# Patient Record
Sex: Male | Born: 1995 | Race: Black or African American | Hispanic: No | Marital: Single | Smoking: Never smoker
Health system: Southern US, Community
[De-identification: ages and names within clinical notes are randomized; demographics above are authoritative.]

## PROBLEM LIST (undated history)

## (undated) DIAGNOSIS — J45909 Unspecified asthma, uncomplicated: Secondary | ICD-10-CM

## (undated) DIAGNOSIS — K219 Gastro-esophageal reflux disease without esophagitis: Secondary | ICD-10-CM

## (undated) DIAGNOSIS — I10 Essential (primary) hypertension: Secondary | ICD-10-CM

## (undated) DIAGNOSIS — Z9109 Other allergy status, other than to drugs and biological substances: Secondary | ICD-10-CM

## (undated) DIAGNOSIS — M92519 Juvenile osteochondrosis of proximal tibia, unspecified leg: Secondary | ICD-10-CM

## (undated) DIAGNOSIS — F4310 Post-traumatic stress disorder, unspecified: Secondary | ICD-10-CM

## (undated) DIAGNOSIS — G4730 Sleep apnea, unspecified: Secondary | ICD-10-CM

## (undated) DIAGNOSIS — M9250 Juvenile osteochondrosis of tibia and fibula, unspecified leg: Secondary | ICD-10-CM

## (undated) HISTORY — DX: Post-traumatic stress disorder, unspecified: F43.10

## (undated) HISTORY — DX: Other allergy status, other than to drugs and biological substances: Z91.09

## (undated) HISTORY — DX: Juvenile osteochondrosis of proximal tibia, unspecified leg: M92.519

## (undated) HISTORY — DX: Juvenile osteochondrosis of tibia and fibula, unspecified leg: M92.50

---
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
## 2016-01-13 ENCOUNTER — Other Ambulatory Visit: Payer: Self-pay | Admitting: *Deleted

## 2016-01-13 DIAGNOSIS — J302 Other seasonal allergic rhinitis: Secondary | ICD-10-CM

## 2016-01-13 MED ORDER — CETIRIZINE HCL 10 MG PO TABS: ORAL_TABLET | ORAL | Status: DC

## 2016-01-13 MED ORDER — FLUTICASONE PROPIONATE 50 MCG/ACT NA SUSP: 2.0000 | Freq: Every day | NASAL | Status: DC

## 2016-02-13 DIAGNOSIS — F609 Personality disorder, unspecified: Secondary | ICD-10-CM | POA: Insufficient documentation

## 2016-02-17 ENCOUNTER — Ambulatory Visit (INDEPENDENT_AMBULATORY_CARE_PROVIDER_SITE_OTHER): Payer: Medicaid Other | Admitting: *Deleted

## 2016-02-17 DIAGNOSIS — Z111 Encounter for screening for respiratory tuberculosis: Secondary | ICD-10-CM | POA: Diagnosis not present

## 2016-02-17 NOTE — Progress Notes (Signed)
   PPD placed Left Forearm.  Pt to return 02/19/2016 for reading.  Pt tolerated intradermal injection. Clovis PuMartin, Marguerita Stapp L, RN

## 2016-02-19 ENCOUNTER — Ambulatory Visit (INDEPENDENT_AMBULATORY_CARE_PROVIDER_SITE_OTHER): Payer: Medicaid Other | Admitting: *Deleted

## 2016-02-19 ENCOUNTER — Encounter: Payer: Self-pay | Admitting: *Deleted

## 2016-02-19 DIAGNOSIS — Z7689 Persons encountering health services in other specified circumstances: Secondary | ICD-10-CM

## 2016-02-19 DIAGNOSIS — Z111 Encounter for screening for respiratory tuberculosis: Secondary | ICD-10-CM

## 2016-02-19 LAB — TB SKIN TEST: TB SKIN TEST: NEGATIVE

## 2016-02-19 NOTE — Progress Notes (Signed)
Patient ID: Aaron Alvarado, male   DOB: 1996-01-26, 20 y.o.   MRN: [id number]  Alvan Dameevin presents today for PPD read.  This was placed on 02/17/16 @ 9:13am.  It was read on 02/19/16 @ 9:20am.  There was a 9mm induration but pt with no risk factors (verified By Dr. Mauricio PoBreen).  Pt given letter to take to job. Venesha Petraitis, Maryjo RochesterJessica Dawn, CMA

## 2016-04-13 ENCOUNTER — Other Ambulatory Visit: Payer: Self-pay | Admitting: Family Medicine

## 2016-04-14 ENCOUNTER — Other Ambulatory Visit: Payer: Self-pay | Admitting: *Deleted

## 2016-04-14 DIAGNOSIS — J302 Other seasonal allergic rhinitis: Secondary | ICD-10-CM

## 2016-05-25 ENCOUNTER — Other Ambulatory Visit: Payer: Self-pay | Admitting: Family Medicine

## 2016-07-09 ENCOUNTER — Ambulatory Visit (HOSPITAL_COMMUNITY)
Admission: EM | Admit: 2016-07-09 | Discharge: 2016-07-09 | Disposition: A | Discharge status: Home / Self Care | Payer: Medicaid Other

## 2016-07-09 ENCOUNTER — Encounter (HOSPITAL_COMMUNITY): Payer: Self-pay | Admitting: Emergency Medicine

## 2016-07-09 DIAGNOSIS — R197 Diarrhea, unspecified: Secondary | ICD-10-CM

## 2016-07-09 NOTE — ED Provider Notes (Signed)
CSN: [id number]     Arrival date & time 07/09/16  1812 History   None    Chief Complaint  Patient presents with  . Rectal Problems   (Consider location/radiation/quality/duration/timing/severity/associated sxs/prior Treatment) Patient presents with diarrhea X 3 days with mucousy diarrhea X 2 episodes. Condition is acute in nature. Condition is made better by nothing Condition is made worse by worse Patient denies any treatment prior to there arrival at this facility. Patient denies any fevers but states that he has not had an appetite and the diarrhea is "getting better"Patient denies any fevers. Pain with urination, nausea vomiting or congestion.        Past Medical History:  Diagnosis Date  . Asthma   . Hypertension    Past Surgical History:  Procedure Laterality Date  . KNEE ARTHROSCOPY  2011   History reviewed. No pertinent family history. Social History  Substance Use Topics  . Smoking status: Never Smoker  . Smokeless tobacco: Never Used  . Alcohol use No    Review of Systems  Constitutional: Positive for appetite change. Negative for fever.  HENT: Negative.   Respiratory: Negative.   Gastrointestinal: Positive for abdominal pain and diarrhea.  Genitourinary: Negative.     Allergies  Penicillins  Home Medications   Prior to Admission medications   Medication Sig Start Date End Date Taking? Authorizing Provider  cetirizine (ZYRTEC) 10 MG tablet TAKE 1 TABLET BY MOUTH DAILY 05/25/16  Yes Nestor RampSara L Neal, MD  fluticasone [facility](FLONASE) 50 MCG/ACT nasal spray Place 2 sprays into both nostrils daily. 01/13/16 01/12/17 Yes Nestor RampSara L Neal, MD  azithromycin (ZITHROMAX) 250 MG tablet 2 tabs po for 5 days. Rph, please call pt when ready. 05/27/15   Hayden Rasmussenavid Mabe, NP  ketorolac (TORADOL) 10 MG tablet Take 1 tablet (10 mg total) by mouth 4 (four) times daily as needed. 05/23/15   Hayden Rasmussenavid Mabe, NP  PROAIR HFA 108 (90 Base) MCG/ACT inhaler INHALE 2-4 PUFFS INTO THE LUNGS AS NEEDED FOR WHEEZING.  EVERY 4-6 HRS AS NEEDED WHEEZING 04/13/16   Moses MannersWilliam A Hensel, MD  traMADol (ULTRAM) 50 MG tablet Take 1 tablet (50 mg total) by mouth every 6 (six) hours as needed. 05/23/15   Hayden Rasmussenavid Mabe, NP   Meds Ordered and Administered this Visit  Medications - No data to display  BP 135/83 (BP Location: Right Arm)   Pulse 73   Temp 98 F (36.7 C) (Oral)   Resp 12   SpO2 99%  No data found.   Physical Exam  Constitutional: He appears well-developed and well-nourished.  Cardiovascular: Normal rate and regular rhythm.   Pulmonary/Chest: Effort normal and breath sounds normal.  Abdominal: Soft. There is tenderness ( lower quadrants).  Hyperactive bowels sounds noted.     Urgent Care Course   Clinical Course    Procedures (including critical care time)  Labs Review Labs Reviewed - No data to display  Imaging Review No results found.   Visual Acuity Review  Right Eye Distance:   Left Eye Distance:   Bilateral Distance:    Right Eye Near:   Left Eye Near:    Bilateral Near:         MDM   1. Diarrhea, unspecified type       Alene MiresJennifer C Marillyn Goren, NP 07/09/16 1906    Alene MiresJennifer C Javonn Gauger, NP 07/09/16 1911

## 2016-07-09 NOTE — ED Triage Notes (Signed)
Pt reports he noticed a "mucousy" rectal d/c this am associated w/diarrhea and abd pain  Denies fevers, chills  Sexually active w/male partner  A&O x4... NAD

## 2016-07-09 NOTE — ED Notes (Signed)
Pt d/c by Jennifer, NP 

## 2016-07-09 NOTE — Discharge Instructions (Signed)
Diet as tolerated. Continue to push fluids.

## 2016-10-04 ENCOUNTER — Ambulatory Visit: Payer: Medicaid Other | Admitting: Student

## 2016-10-04 ENCOUNTER — Telehealth: Payer: Self-pay | Admitting: *Deleted

## 2016-10-04 NOTE — Telephone Encounter (Signed)
Ok I will see him as well THANKS! Denny LevySara Mahki Spikes

## 2016-10-04 NOTE — Telephone Encounter (Signed)
Patient originally was scheduled on same day today for a sore throat but mom called to cancel it.  She would like for patient to be seen the same time that she is coming in to see Dr. Jennette KettleNeal on Wednesday 11/22.  Offered her an appointment with another provider at the same time on Wednesday since PCP's schedule is full but mom declined and asked me to check with pcp to see if patient can be squeezed in.  Mom plans to bring son in with her on this day anyway. Will forward to Md to advise. Tacarra Justo,CMA

## 2016-10-04 NOTE — Telephone Encounter (Signed)
Patient is aware. Jazmin Hartsell,CMA  

## 2016-10-06 ENCOUNTER — Encounter: Payer: Self-pay | Admitting: Family Medicine

## 2016-10-06 ENCOUNTER — Ambulatory Visit (INDEPENDENT_AMBULATORY_CARE_PROVIDER_SITE_OTHER): Payer: Medicaid Other | Admitting: Family Medicine

## 2016-10-06 DIAGNOSIS — J302 Other seasonal allergic rhinitis: Secondary | ICD-10-CM | POA: Diagnosis not present

## 2016-10-06 DIAGNOSIS — Z23 Encounter for immunization: Secondary | ICD-10-CM | POA: Diagnosis not present

## 2016-10-06 MED ORDER — AZITHROMYCIN 250 MG PO TABS: ORAL_TABLET | ORAL | 0 refills | Status: DC

## 2016-10-06 MED ORDER — CETIRIZINE HCL 10 MG PO TABS: 10.0000 mg | ORAL_TABLET | Freq: Every day | ORAL | 3 refills | Status: DC

## 2016-10-06 MED ORDER — FLUTICASONE PROPIONATE 50 MCG/ACT NA SUSP: 2.0000 | Freq: Every day | NASAL | 12 refills | Status: DC

## 2016-10-06 NOTE — Progress Notes (Signed)
    CHIEF COMPLAINT / HPI:  Bilateral ear pain. Also had a bit of a sore throat. Has not had fever but has felt a little bit fatigued. No cough. #2. Has a lesion on his face that is bothersome to him. It has been progressively enlarging over the last 2 months. Not itchy. No bleeding. REVIEW OF SYSTEMS:  See history of present illness  OBJECTIVE:  Vital signs are reviewed.   GEN.: Well-developed male no acute distress SKIN: Pedunculated lesion with some tuffs on the right cheek HEENT: TMs bilaterally red, poor details, poorly mobile oropharynx is erythematous without any the Xarelto which are reviewed  ASSESSMENT / PLAN: Otitis media we'll treat with azithromycin #2. Facial lesion that looks like a wart. I'll have him come back to the office for removal.

## 2016-10-14 ENCOUNTER — Ambulatory Visit (INDEPENDENT_AMBULATORY_CARE_PROVIDER_SITE_OTHER): Payer: Medicaid Other | Admitting: Family Medicine

## 2016-10-14 VITALS — BP 183/90 | HR 82 | Temp 98.1°F | Ht 62.0 in | Wt 265.6 lb

## 2016-10-14 DIAGNOSIS — B079 Viral wart, unspecified: Secondary | ICD-10-CM | POA: Diagnosis present

## 2016-10-14 NOTE — Progress Notes (Signed)
Wart removal.  Patient given informed consent, signed copy in the chart. Area of right cheek cleaned and prepped in usual sterile fashion. One cc of 1% lidocaine was used for local anesthesia. The0.6 cm (at base)  filliform wart was then shaved at the base and removed. A small amount of monsel's solution and local pressure  was used for hemostasis which was easily obtained. The area was cleaned and covered with a ]bandaid. The wart will be sent for pathology due to its rapid growth and unusual presentation. After care instructions were provided. I will notify him of pathology findings.

## 2016-10-19 ENCOUNTER — Encounter: Payer: Self-pay | Admitting: Family Medicine

## 2016-11-17 ENCOUNTER — Encounter: Payer: Self-pay | Admitting: Family Medicine

## 2016-11-17 ENCOUNTER — Ambulatory Visit (INDEPENDENT_AMBULATORY_CARE_PROVIDER_SITE_OTHER): Payer: Medicaid Other | Admitting: Family Medicine

## 2016-11-17 VITALS — BP 116/88 | HR 57 | Temp 98.5°F | Wt 272.0 lb

## 2016-11-17 DIAGNOSIS — J029 Acute pharyngitis, unspecified: Secondary | ICD-10-CM

## 2016-11-17 DIAGNOSIS — H6591 Unspecified nonsuppurative otitis media, right ear: Secondary | ICD-10-CM

## 2016-11-17 DIAGNOSIS — H6691 Otitis media, unspecified, right ear: Secondary | ICD-10-CM

## 2016-11-17 DIAGNOSIS — H6690 Otitis media, unspecified, unspecified ear: Secondary | ICD-10-CM | POA: Insufficient documentation

## 2016-11-17 LAB — POCT RAPID STREP A (OFFICE): RAPID STREP A SCREEN: NEGATIVE

## 2016-11-17 MED ORDER — AZITHROMYCIN 250 MG PO TABS: ORAL_TABLET | ORAL | 0 refills | Status: DC

## 2016-11-17 NOTE — Assessment & Plan Note (Signed)
Most likely viral in nature. Rapid strep negative here. Given constellation of symptoms, less likely strep given cough, afebrile, no LAD, no exudate, and age. No culture sent for this reason. No red flags on exam. - discussed symptomatic management with things like gargling with salt water, lozenges, chloraseptic spray, honey for cough, humidifer.  - return precaution discussed.

## 2016-11-17 NOTE — Patient Instructions (Signed)
I have prescribed you an antibiotic (azithromycin) for a right ear infection. Continue Flonase, Zyrtec, and Sudafed for the congestion. Start using saline washes (like the Neti pot) as well. If your symptoms worsen or fail to improve, please follow up in our clinic.

## 2016-11-17 NOTE — Progress Notes (Signed)
    Subjective: ZO:XWRUCC:sore throat HPI: Patient is a 21 y.o. male presenting to clinic today for a SDA for sore throat.  Friday patient noted a sore throat which worsened on Sunday. Throat pain has been stable since then.  Patient notes rhinorrhea and nasal congestion which started on Sunday. He notes cough that started on Monday. Cough is non-productive.   On ROS, he denies SOB or chest pain, otalgias, myalgias. Fevers/chills.   He denies sick contacts.   He takes Flonase and Zyrtec. He took Sudafed (phenylephrine) yesterday.   Social History: never smoker  Flu Vaccine: up to date   ROS: All other systems reviewed and are negative.  Past Medical History Patient Active Problem List   Diagnosis Date Noted  . Otitis media 11/17/2016  . Headache 05/26/2015  . Acute maxillary sinusitis 01/20/2015  . Acute pharyngitis 01/16/2015  . 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 H/O: HTN (hypertension) 10/26/2011  . History of acanthosis skin now resolved 2015 10/26/2011  . Historyof GERD 05/06/2011  . Obesity, unspecified 04/10/2010  . Childhood asthma 07/05/2007  . Allergic rhinitis 01/12/2007    Medications- reviewed and updated  Objective: Office vital signs reviewed. BP 116/88   Pulse (!) 57   Temp 98.5 F (36.9 C) (Oral)   Wt 272 lb (123.4 kg)   BMI 49.75 kg/m    Physical Examination:  General: Awake, alert, well- nourished, NAD No LAD noted.  No facial tenderness noted.  ENMT: Initially noted to have complete cerumen impaction on R, partial on the left. After irrigation,  L TM intact, normal light reflex, no erythema, no bulging. R TM intact, erythematous, bulging, with small amount of purulence.  Nasal turbinates boggy. MMM, Oropharynx clear with mild erythema. No tonsillar exudate/hypertrophy Eyes: Conjunctiva non-injected. PERRL.  Cardio: RRR, no m/r/g noted.  Pulm: No increased WOB.  CTAB, without wheezes, rhonchi or crackles noted.   Rapid strep  negative  Assessment/Plan: Acute pharyngitis Most likely viral in nature. Rapid strep negative here. Given constellation of symptoms, less likely strep given cough, afebrile, no LAD, no exudate, and age. No culture sent for this reason. No red flags on exam. - discussed symptomatic management with things like gargling with salt water, lozenges, chloraseptic spray, honey for cough, humidifer.  - return precaution discussed.   Otitis media Discussed this is most likely viral in origin and would most likely would resolve, however patient opted for antibiotic therapy. - rx for azithromycin (as pt has a h/o hives with PCN) - discussed return precautions    Orders Placed This Encounter  Procedures  . POCT rapid strep A    Meds ordered this encounter  Medications  . azithromycin (ZITHROMAX) 250 MG tablet    Sig: Take 2 tablets (500mg ) on first day, then daily after that    Dispense:  6 each    Refill:  0    Joanna Puffrystal S. Xai Frerking PGY-3, [facility]

## 2016-11-17 NOTE — Assessment & Plan Note (Signed)
Discussed this is most likely viral in origin and would most likely would resolve, however patient opted for antibiotic therapy. - rx for azithromycin (as pt has a h/o hives with PCN) - discussed return precautions

## 2016-12-26 ENCOUNTER — Other Ambulatory Visit: Payer: Self-pay | Admitting: Family Medicine

## 2017-02-09 ENCOUNTER — Ambulatory Visit (INDEPENDENT_AMBULATORY_CARE_PROVIDER_SITE_OTHER): Payer: Medicaid Other | Admitting: Family Medicine

## 2017-02-09 ENCOUNTER — Encounter: Payer: Self-pay | Admitting: Family Medicine

## 2017-02-09 VITALS — BP 110/85 | HR 94 | Temp 97.8°F | Ht 71.26 in | Wt 249.2 lb

## 2017-02-09 DIAGNOSIS — K5900 Constipation, unspecified: Secondary | ICD-10-CM

## 2017-02-09 MED ORDER — POLYETHYLENE GLYCOL 3350 17 GM/SCOOP PO POWD: 17.0000 g | Freq: Every day | ORAL | 0 refills | Status: DC

## 2017-02-09 NOTE — Patient Instructions (Signed)
High-Fiber Diet Fiber, also called dietary fiber, is a type of carbohydrate found in fruits, vegetables, whole grains, and beans. A high-fiber diet can have many health benefits. Your health care provider may recommend a high-fiber diet to help:  Prevent constipation. Fiber can make your bowel movements more regular.  Lower your cholesterol.  Relieve hemorrhoids, uncomplicated diverticulosis, or irritable bowel syndrome.  Prevent overeating as part of a weight-loss plan.  Prevent heart disease, type 2 diabetes, and certain cancers.  What is my plan? The recommended daily intake of fiber includes:  38 grams for men under age 50.  30 grams for men over age 50.  25 grams for women under age 50.  21 grams for women over age 50.  You can get the recommended daily intake of dietary fiber by eating a variety of fruits, vegetables, grains, and beans. Your health care provider may also recommend a fiber supplement if it is not possible to get enough fiber through your diet. What do I need to know about a high-fiber diet?  Fiber supplements have not been widely studied for their effectiveness, so it is better to get fiber through food sources.  Always check the fiber content on thenutrition facts label of any prepackaged food. Look for foods that contain at least 5 grams of fiber per serving.  Ask your dietitian if you have questions about specific foods that are related to your condition, especially if those foods are not listed in the following section.  Increase your daily fiber consumption gradually. Increasing your intake of dietary fiber too quickly may cause bloating, cramping, or gas.  Drink plenty of water. Water helps you to digest fiber. What foods can I eat? Grains Whole-grain breads. Multigrain cereal. Oats and oatmeal. Brown rice. Barley. Bulgur wheat. Millet. Bran muffins. Popcorn. Rye wafer crackers. Vegetables Sweet potatoes. Spinach. Kale. Artichokes. Cabbage.  Broccoli. Green peas. Carrots. Squash. Fruits Berries. Pears. Apples. Oranges. Avocados. Prunes and raisins. Dried figs. Meats and Other Protein Sources Navy, kidney, pinto, and soy beans. Split peas. Lentils. Nuts and seeds. Dairy Fiber-fortified yogurt. Beverages Fiber-fortified soy milk. Fiber-fortified orange juice. Other Fiber bars. The items listed above may not be a complete list of recommended foods or beverages. Contact your dietitian for more options. What foods are not recommended? Grains White bread. Pasta made with refined flour. White rice. Vegetables Fried potatoes. Canned vegetables. Well-cooked vegetables. Fruits Fruit juice. Cooked, strained fruit. Meats and Other Protein Sources Fatty cuts of meat. Fried poultry or fried fish. Dairy Milk. Yogurt. Cream cheese. Sour cream. Beverages Soft drinks. Other Cakes and pastries. Butter and oils. The items listed above may not be a complete list of foods and beverages to avoid. Contact your dietitian for more information. What are some tips for including high-fiber foods in my diet?  Eat a wide variety of high-fiber foods.  Make sure that half of all grains consumed each day are whole grains.  Replace breads and cereals made from refined flour or white flour with whole-grain breads and cereals.  Replace white rice with brown rice, bulgur wheat, or millet.  Start the day with a breakfast that is high in fiber, such as a cereal that contains at least 5 grams of fiber per serving.  Use beans in place of meat in soups, salads, or pasta.  Eat high-fiber snacks, such as berries, raw vegetables, nuts, or popcorn. This information is not intended to replace advice given to you by your health care provider. Make sure you discuss any   questions you have with your health care provider. Document Released: 11/01/2005 Document Revised: 04/08/2016 Document Reviewed: 04/16/2014 Elsevier Interactive Patient Education  2017  Elsevier Inc.  

## 2017-02-09 NOTE — Assessment & Plan Note (Signed)
Symptoms consistent with constipation Benign abdominal exam and vital signs stable Discussed increasing fiber in his diet Start daily Miralax and can increase to twice daily as needed for one soft bowel movement daily Return precautions discussed

## 2017-02-09 NOTE — Progress Notes (Signed)
   Subjective:   Aaron Alvarado is a 21 y.o. male with a history of GERD, allergic rhinitis, obesity here for same day appt for  Chief Complaint  Patient presents with  . Abdominal Pain    x 1 year/ getting worse     Patient reports diffuse, intermittent abdominal pain for about one year that worsened 2 days ago. He reports he often has sharp pains when he needs to have a bowel movement and then has to strain and has hard stools. He is tried Pepto-Bismol, but this does not help. He became concerned 2 days ago when he had a sharp upper abdominal pain immediately after eating a meal of fried chicken and black-eyed peas. This lasted for 10 minutes and self resolved and has not recurred. He denies any nausea, vomiting, diarrhea, repeat episodes of sharp pain, fevers, hematemesis, hematochezia, melena, dysuria, unintentional weight loss, loss of appetite, dysphagia.  Review of Systems:  Per HPI.   Social History: never smoker  Objective:  BP 110/85 (BP Location: Right Arm, Patient Position: Sitting, Cuff Size: Large)   Pulse 94   Temp 97.8 F (36.6 C) (Oral)   Ht 5' 11.26" (1.81 m)   Wt 249 lb 3.2 oz (113 kg)   SpO2 98%   BMI 34.50 kg/m   Gen:  21 y.o. male in NAD HEENT: NCAT, MMM, anicteric sclerae, OP clear CV: RRR, no MRG Resp: Non-labored, CTAB, no wheezes noted Abd: Soft, diffusely mildly TTP, ND, BS present, no guarding/rebound Ext: WWP, no edema MSK: No obvious deformities, gait intact Neuro: Alert and oriented, speech normal     Assessment & Plan:     Aaron Alvarado is a 21 y.o. male here for  Constipation Symptoms consistent with constipation Benign abdominal exam and vital signs stable Discussed increasing fiber in his diet Start daily Miralax and can increase to twice daily as needed for one soft bowel movement daily Return precautions discussed   Erasmo DownerAngela M Bacigalupo, MD MPH PGY-3,  [facility] 02/09/2017  9:03 AM

## 2017-03-17 ENCOUNTER — Ambulatory Visit (INDEPENDENT_AMBULATORY_CARE_PROVIDER_SITE_OTHER): Payer: Medicaid Other | Admitting: Family Medicine

## 2017-03-17 ENCOUNTER — Other Ambulatory Visit (HOSPITAL_COMMUNITY)
Admission: RE | Admit: 2017-03-17 | Discharge: 2017-03-17 | Disposition: A | Discharge status: Home / Self Care | Payer: Medicaid Other | Source: Ambulatory Visit | Attending: Family Medicine | Admitting: Family Medicine

## 2017-03-17 VITALS — BP 128/70 | HR 65 | Temp 98.3°F | Wt 243.2 lb

## 2017-03-17 DIAGNOSIS — X58XXXA Exposure to other specified factors, initial encounter: Secondary | ICD-10-CM | POA: Diagnosis not present

## 2017-03-17 DIAGNOSIS — Z113 Encounter for screening for infections with a predominantly sexual mode of transmission: Secondary | ICD-10-CM | POA: Insufficient documentation

## 2017-03-17 DIAGNOSIS — Z7721 Contact with and (suspected) exposure to potentially hazardous body fluids: Secondary | ICD-10-CM

## 2017-03-17 NOTE — Progress Notes (Signed)
   Subjective:  Aaron Alvarado is a 21 y.o. male [facility] with a chief complaint of needle exposure.   HPI:  Needle Exposure Patient was walking home from school yesterday when he noticed a sharp pain in the bottom of his right foot. He looked down and found a hypodermic needle sticking throughout the sole of his shoe into his foot. He wants to be tested for communicable diseases today including HIV. He is up to date with his tetanus shot.  No fevers, chills. No tenderness, redness, or drainage at the site of the wound.   ROS: Per HPI  PMH: Smoking history reviewed.   Objective:  Physical Exam: BP 128/70   Pulse 65   Temp 98.3 F (36.8 C) (Oral)   Wt 243 lb 3.2 oz (110.3 kg)   SpO2 97%   BMI 33.67 kg/m   Gen: NAD, resting comfortably MSK:  - R foot: Small needle mark noted without surrounding erythema or drainage. Skin: warm, dry Neuro: grossly normal, moves all extremities Psych: Normal affect and thought content  Assessment/Plan:  Possible Bloodborne pathogen exposure Called the National Clinician's Post-Exposure Prophylaxis Hotline at [phone]. They recommended against post exposure prophylaxis against HIV. Recommended checking HIV antibody, hepatitis C antibody, and hepatitis B titers today. If negative, recommended rechecking HIV antibody at 3 weeks and then 6 months. Recommended rechecking hepatitis C at 3 months and again at 6 months. Discussed this with patient who voiced understanding. Patient up to date with tetanus - no need for vaccination today.  STD screening Will check RPR and GC/CT in addition today per patient request.   Katina Degreealeb M. Jimmey RalphParker, MD [facility] Resident PGY-3 03/17/2017 12:25 PM

## 2017-03-17 NOTE — Patient Instructions (Signed)
We will check blood work today.  Come back in 3 weeks for repeat testing.  Take care,  Dr Jimmey RalphParker

## 2017-03-17 NOTE — Assessment & Plan Note (Signed)
Called the National Clinician's Post-Exposure Prophylaxis Hotline at [phone]. They recommended against post exposure prophylaxis against HIV. Recommended checking HIV antibody, hepatitis C antibody, and hepatitis B titers today. If negative, recommended rechecking HIV antibody at 3 weeks and then 6 months. Recommended rechecking hepatitis C at 3 months and again at 6 months. Discussed this with patient who voiced understanding. Patient up to date with tetanus - no need for vaccination today.

## 2017-03-18 LAB — HEPATITIS B SURFACE ANTIGEN: Hepatitis B Surface Ag: NEGATIVE

## 2017-03-18 LAB — HIV ANTIBODY (ROUTINE TESTING W REFLEX): HIV Screen 4th Generation wRfx: NONREACTIVE

## 2017-03-18 LAB — RPR: RPR Ser Ql: NONREACTIVE

## 2017-03-18 LAB — URINE CYTOLOGY ANCILLARY ONLY
CHLAMYDIA, DNA PROBE: NEGATIVE
NEISSERIA GONORRHEA: NEGATIVE

## 2017-03-18 LAB — HEPATITIS B CORE ANTIBODY, TOTAL: Hep B Core Total Ab: NEGATIVE

## 2017-03-18 LAB — HEPATITIS C ANTIBODY: Hep C Virus Ab: <0.1 s/co ratio (ref 0.0–0.9)

## 2017-03-18 LAB — HEPATITIS B SURFACE ANTIBODY,QUALITATIVE: HEP B SURFACE AB, QUAL: NONREACTIVE

## 2017-03-21 ENCOUNTER — Telehealth: Payer: Self-pay | Admitting: Family Medicine

## 2017-03-21 NOTE — Telephone Encounter (Signed)
Attempted to call patient, though no answer and no VM set up. Lab results are negative but they also show that he is not immune to hepatitis B. He will need to complete the vaccine series again.  Katina Degreealeb M. Jimmey RalphParker, MD [facility] Resident PGY-3 03/21/2017 10:56 AM

## 2017-03-21 NOTE — Telephone Encounter (Signed)
Pt informed of negative test results and nurse visit was scheduled for Hep b vaccine.

## 2017-03-23 ENCOUNTER — Ambulatory Visit: Payer: Medicaid Other

## 2017-03-29 ENCOUNTER — Ambulatory Visit: Payer: Medicaid Other

## 2017-07-13 ENCOUNTER — Ambulatory Visit (INDEPENDENT_AMBULATORY_CARE_PROVIDER_SITE_OTHER): Payer: Medicaid Other | Admitting: *Deleted

## 2017-07-13 DIAGNOSIS — Z111 Encounter for screening for respiratory tuberculosis: Secondary | ICD-10-CM | POA: Diagnosis not present

## 2017-07-13 NOTE — Progress Notes (Signed)
   PPD placed Left Forearm.  Pt to return 07/15/2017 for reading.  Pt tolerated intradermal injection. Clovis Pu, RN

## 2017-07-15 ENCOUNTER — Ambulatory Visit (INDEPENDENT_AMBULATORY_CARE_PROVIDER_SITE_OTHER): Payer: Medicaid Other | Admitting: *Deleted

## 2017-07-15 ENCOUNTER — Encounter: Payer: Self-pay | Admitting: *Deleted

## 2017-07-15 DIAGNOSIS — Z111 Encounter for screening for respiratory tuberculosis: Secondary | ICD-10-CM

## 2017-07-15 LAB — TB SKIN TEST
Induration: 0 mm
TB Skin Test: NEGATIVE

## 2017-07-15 NOTE — Progress Notes (Signed)
   PPD Reading Note PPD read and results entered in EpicCare. Result: 0 mm induration. Interpretation: Negative If test not read within 48-72 hours of initial placement, patient advised to repeat in other arm 1-3 weeks after this test. Allergic reaction: no  Josia Cueva L, RN  

## 2017-10-04 ENCOUNTER — Ambulatory Visit (INDEPENDENT_AMBULATORY_CARE_PROVIDER_SITE_OTHER): Payer: Medicaid Other | Admitting: Student

## 2017-10-04 ENCOUNTER — Encounter: Payer: Self-pay | Admitting: Student

## 2017-10-04 ENCOUNTER — Other Ambulatory Visit: Payer: Self-pay

## 2017-10-04 ENCOUNTER — Other Ambulatory Visit (HOSPITAL_COMMUNITY)
Admission: RE | Admit: 2017-10-04 | Discharge: 2017-10-04 | Disposition: A | Discharge status: Home / Self Care | Payer: Medicaid Other | Source: Ambulatory Visit | Attending: Family Medicine | Admitting: Family Medicine

## 2017-10-04 VITALS — BP 124/72 | HR 83 | Temp 98.4°F | Ht 71.0 in | Wt 259.4 lb

## 2017-10-04 DIAGNOSIS — Z7251 High risk heterosexual behavior: Secondary | ICD-10-CM

## 2017-10-04 DIAGNOSIS — Z23 Encounter for immunization: Secondary | ICD-10-CM | POA: Diagnosis not present

## 2017-10-04 DIAGNOSIS — Z113 Encounter for screening for infections with a predominantly sexual mode of transmission: Secondary | ICD-10-CM | POA: Insufficient documentation

## 2017-10-04 NOTE — Patient Instructions (Addendum)
It was great seeing you today! It appears that you have a viral upper respiratory infection (Common Cold).  Cold symptoms can last up to 2 weeks.    - A tablespoonful of honey before bedtime is helpful for cough - Get plenty of rest and adequate hydration. - Consume warm fluids (soup or tea) to provide relief for a stuffy nose and to loosen phlegm. - For nasal stuffiness, try saline nasal spray or a Neti Pot. Eating warm liquids such as chicken soup or tea may also help with nasal congestion. - For sore throat pain relief: suck on throat lozenges, hard candy or popsicles; gargle with warm salt water (1/4 tsp. salt per 8 oz. of water); and eat soft, bland foods. - Eat a well-balanced diet. If you cannot, ensure you are getting enough nutrients by taking a daily multivitamin. - Avoid dairy products, as they can thicken phlegm. - Avoid alcohol, as it impairs your body's immune system.  CONTACT YOUR DOCTOR IF YOU EXPERIENCE ANY OF THE FOLLOWING: - High fever, chest pain, shortness of breath or  not able to keep down food or fluids.  - Cough that gets worse while other cold symptoms improve - Flare up of any chronic lung problem, such as asthma - Your symptoms persist longer than 2 weeks  https://owens.org/https://www.cdc.gov/hiv/risk/prep/index.html  If we did any lab work today, and the results require attention, either me or my nurse will get in touch with you. If everything is normal, you will get a letter in mail and a message via . If you don't hear from us in two weeks, please give us a call. Otherwise, we look forward to seeing you again at your next visit. If you have any questions or concerns before then, please call the clinic at [phone].  Please bring all your medications to every doctors visit  Sign up for My Chart to have easy access to your labs results, and communication with your Primary care physician.    Please check-out at the front desk before leaving the clinic.    Take Care,    Dr. Alanda SlimGonfa

## 2017-10-04 NOTE — Progress Notes (Cosign Needed)
  Subjective:    Aaron Alvarado is a 21 y.o. old male here for sore throat  HPI Sore throat: this has been going on for three days. He went out partying three days ago and his throat started burning the next day. He also had "low energy" and felt "unwell". He had cough, sneezing and nasal congestion. His throat hurts with cold water but not with warm fluid. Cough is mostly dry. Denies fever, shortness of breath. Some chest pain with cough or sneezing. His throat is better today. He is sexually active with males and females. Reports two male partners in the last 6 months. He is on both receiving and penetrating. He reports both oral and anal sex. Denies using condom. Reports having oral sex three days ago before his throat starting hurting. Denies penile discharge, lesion, scrotal pain or anal discharge.    Denies history of STD, smoking, drinking or recreational drug use PMH/Problem List: has Obesity, unspecified; Allergic rhinitis; Childhood asthma; Historyof GERD; H/O: HTN (hypertension); History of acanthosis skin now resolved 2015; Blount's disease; Constipation; Headache; and Needle exposure on their problem list.   has a past medical history of Asthma and Hypertension.  FH:  No family history on file.  SH Social History   Tobacco Use  . Smoking status: Never Smoker  . Smokeless tobacco: Never Used  Substance Use Topics  . Alcohol use: No  . Drug use: No    Review of Systems Review of systems negative except for pertinent positives and negatives in history of present illness above.     Objective:     Vitals:   10/04/17 1140  BP: 124/72  Pulse: 83  Temp: 98.4 F (36.9 C)  TempSrc: Oral  SpO2: 98%  Weight: 259 lb 6.4 oz (117.7 kg)  Height: 5\' 11"  (1.803 m)   Body mass index is 36.18 kg/m.  Physical Exam GEN: appears well, no ditress EYES: PERRL, EOMI THROAT: MMM, no erythema or exudation, no cobblestoning NECK: Supple, mild l tenderness to palpation over anterior  extremities bilaterally.  No palpable lymph nodes.  Full range of motion. RESP:  No IWOB, CTAB CVS:  RRR, normal S1&S2, no murmurs GI: soft, NT with active BS GU: Patient declined.  Denies lesion or abnormality MSK: No focal tenderness NEURO: Grossly intact PSYCH: normal affect    Assessment and Plan:  1. Screening for STD (sexually transmitted disease): MSM patient at increased risk of STI.  Discussed about PEP.  He is interested in this.  I have also recommended consistent use of protection (condom).  I would refer him to ID clinic for further discussion and initiation of PEP. - Hepatitis B surface antigen - Hepatitis B core antibody, IgM - HIV antibody - Urine cytology ancillary only - RPR - Oropharyngeal swab for GC/CT  2.  Sore throat: Likely due to viral pharyngitis but cannot rule out STI.  -Obtain oropharyngeal swab GC/CT -He has 1/4 on Centor's criteria that argues against strep pharyngitis -Recommended conservative management  3. Need for immunization against influenza - Flu Vaccine QUAD 36+ mos IM  Return in about 1 week (around 10/11/2017) for Medication/PEP.  Almon Herculesaye T Gordon Vandunk, MD 10/04/17 Pager: [phone]

## 2017-10-05 LAB — HIV ANTIBODY (ROUTINE TESTING W REFLEX): HIV Screen 4th Generation wRfx: NONREACTIVE

## 2017-10-05 LAB — HEPATITIS B CORE ANTIBODY, IGM: Hep B C IgM: NEGATIVE

## 2017-10-05 LAB — URINE CYTOLOGY ANCILLARY ONLY
Chlamydia: NEGATIVE
Neisseria Gonorrhea: NEGATIVE
Trichomonas: NEGATIVE

## 2017-10-05 LAB — RPR: RPR Ser Ql: NONREACTIVE

## 2017-10-05 LAB — CERVICOVAGINAL ANCILLARY ONLY
Chlamydia: NEGATIVE
Neisseria Gonorrhea: NEGATIVE

## 2017-10-05 LAB — HEPATITIS B SURFACE ANTIGEN: HEP B S AG: NEGATIVE

## 2017-10-26 ENCOUNTER — Other Ambulatory Visit: Payer: Self-pay | Admitting: Family Medicine

## 2017-10-26 ENCOUNTER — Ambulatory Visit (INDEPENDENT_AMBULATORY_CARE_PROVIDER_SITE_OTHER): Payer: Medicaid Other | Admitting: Family Medicine

## 2017-10-26 ENCOUNTER — Other Ambulatory Visit: Payer: Self-pay

## 2017-10-26 ENCOUNTER — Encounter: Payer: Self-pay | Admitting: Family Medicine

## 2017-10-26 VITALS — BP 124/72 | HR 61 | Temp 98.4°F | Ht 71.0 in | Wt 268.6 lb

## 2017-10-26 DIAGNOSIS — Z7251 High risk heterosexual behavior: Secondary | ICD-10-CM | POA: Insufficient documentation

## 2017-10-26 DIAGNOSIS — Z202 Contact with and (suspected) exposure to infections with a predominantly sexual mode of transmission: Secondary | ICD-10-CM | POA: Diagnosis not present

## 2017-10-26 MED ORDER — EMTRICITABINE-TENOFOVIR DF 200-300 MG PO TABS: 1.0000 | ORAL_TABLET | Freq: Every day | ORAL | 4 refills | Status: DC

## 2017-10-26 MED FILL — TRUVADA 200-300 MG TABS: 200-300 | 30 days supply | Qty: 30 | Fill #0

## 2017-10-26 NOTE — Assessment & Plan Note (Signed)
Discussed sex, STD, PrEP. Handouts given. rx for Truvada. Appropriate labs drawn. Discussed monitoring, condoms. Greater than 50% of our 25 minute office visit was spent in counseling and education regarding these issues.

## 2017-10-26 NOTE — Progress Notes (Signed)
    CHIEF COMPLAINT / HPI:  Here for discussion about STDs and specifically about preexposure prophylaxis for HIV.  No current symptoms.  Sexually active with males and females.  Penetration and reception with males.  REVIEW OF SYSTEMS:  No fever.  No weight loss.  No genital lesions.  No burning on urination.  No skin lesions or rash.  PERTINENT  PMH / PSH: I have reviewed the patient's medications, allergies, past medical and surgical history, smoking status and updated in the EMR as appropriate.   OBJECTIVE:    GENERAL: Well-developed male no acute distress PSYCHIATRIC: Alert and oriented x4.  Affect is interactive. LABORATORY review: Negative HIV, negative RPR, negative hepatitis B core antibody and surface antigen.  Recent urine cytology negative for chlamydia, negative gonorrhea, negative trichomonas.  ASSESSMENT / PLAN: Please see problem oriented charting for details

## 2017-10-27 LAB — CMP14+EGFR
ALBUMIN: 4 g/dL (ref 3.5–5.5)
ALK PHOS: 59 IU/L (ref 39–117)
ALT: 19 IU/L (ref 0–44)
AST: 15 IU/L (ref 0–40)
Albumin/Globulin Ratio: 1.4 (ref 1.2–2.2)
BILIRUBIN TOTAL: 0.5 mg/dL (ref 0.0–1.2)
BUN / CREAT RATIO: 11 (ref 9–20)
BUN: 11 mg/dL (ref 6–20)
CHLORIDE: 105 mmol/L (ref 96–106)
CO2: 26 mmol/L (ref 20–29)
CREATININE: 0.99 mg/dL (ref 0.76–1.27)
Calcium: 9.6 mg/dL (ref 8.7–10.2)
GFR calc Af Amer: 126 mL/min/{1.73_m2} (ref >59)
GFR calc non Af Amer: 109 mL/min/{1.73_m2} (ref >59)
GLUCOSE: 85 mg/dL (ref 65–99)
Globulin, Total: 2.8 g/dL (ref 1.5–4.5)
Potassium: 4 mmol/L (ref 3.5–5.2)
SODIUM: 143 mmol/L (ref 134–144)
Total Protein: 6.8 g/dL (ref 6.0–8.5)

## 2017-10-27 LAB — HEPATITIS C ANTIBODY: HEP C VIRUS AB: 0.1 {s_co_ratio} (ref 0.0–0.9)

## 2017-10-28 LAB — CMP14+EGFR

## 2017-10-28 LAB — HEPATITIS B SURFACE ANTIGEN

## 2017-10-28 LAB — HEPATITIS C ANTIBODY

## 2017-10-28 LAB — HIV ANTIBODY (ROUTINE TESTING W REFLEX)

## 2017-10-29 LAB — SPECIMEN STATUS REPORT

## 2017-10-29 LAB — HEPATITIS B SURFACE ANTIGEN: HEP B S AG: NEGATIVE

## 2017-10-31 ENCOUNTER — Encounter: Payer: Self-pay | Admitting: Family Medicine

## 2017-10-31 ENCOUNTER — Ambulatory Visit: Payer: Medicaid Other | Admitting: Infectious Diseases

## 2017-12-16 ENCOUNTER — Other Ambulatory Visit: Payer: Self-pay | Admitting: Family Medicine

## 2017-12-16 DIAGNOSIS — J302 Other seasonal allergic rhinitis: Secondary | ICD-10-CM

## 2018-01-17 ENCOUNTER — Encounter: Payer: Self-pay | Admitting: Family Medicine

## 2018-01-17 ENCOUNTER — Other Ambulatory Visit (HOSPITAL_COMMUNITY)
Admission: RE | Admit: 2018-01-17 | Discharge: 2018-01-17 | Disposition: A | Discharge status: Home / Self Care | Payer: Medicaid Other | Source: Ambulatory Visit | Attending: Family Medicine | Admitting: Family Medicine

## 2018-01-17 ENCOUNTER — Other Ambulatory Visit: Payer: Self-pay

## 2018-01-17 ENCOUNTER — Ambulatory Visit (INDEPENDENT_AMBULATORY_CARE_PROVIDER_SITE_OTHER): Payer: Medicaid Other | Admitting: Family Medicine

## 2018-01-17 VITALS — BP 138/70 | HR 71 | Temp 98.3°F | Ht 71.0 in | Wt 274.0 lb

## 2018-01-17 DIAGNOSIS — Z113 Encounter for screening for infections with a predominantly sexual mode of transmission: Secondary | ICD-10-CM | POA: Diagnosis not present

## 2018-01-17 DIAGNOSIS — Z5181 Encounter for therapeutic drug level monitoring: Secondary | ICD-10-CM | POA: Insufficient documentation

## 2018-01-17 NOTE — Patient Instructions (Addendum)
It was great seeing you today Mr. Aaron Alvarado! I think that you made a very good choice coming in and getting tested. In regards to our discussion earlier, I gave counseling urging you to take your PReP regularly and use condoms for frequently. We will be testing for HIV, syphilis, Gonorrhea, chlamydia, herpes, Hepatitis B, and Hepatitis C. I will call you when all of these results come back for further management.  In regards to if it is ok to use an enema to clean out your colon. It is not harmful to do this, but would reserve this to instances it is necessary as it can be uncomfortable and there is theoretical risk it could alter your colonic flora.

## 2018-01-17 NOTE — Progress Notes (Signed)
   HPI 22 year old male who presents for STD screening. He has been seen in this clinic in the past for similar testing. He has sex with men and women. He is both receptive and penetrates when he has sex with men. He states that he has sex both rectally and orally. He has been "very inconsistent" with protection. He was started on PrEP therapy after a previous visit. While he does have it, he states that he sometimes forgets to use it. Gave extensive counseling on importance of taking his PrEP as prescribed and using protection during sex.  CC: STD testing   ROS:  Review of Systems  Constitutional: Negative for chills and fever.  Respiratory: Negative for cough and hemoptysis.   Cardiovascular: Negative for chest pain and palpitations.  Gastrointestinal: Negative for abdominal pain, constipation, diarrhea, nausea and vomiting.  Genitourinary: Negative for dysuria.  Neurological: Negative for weakness.    Review of Systems See HPI for ROS.   CC, SH/smoking status, and VS noted  Objective: BP 138/70   Pulse 71   Temp 98.3 F (36.8 C) (Oral)   Ht 5\' 11"  (1.803 m)   Wt 274 lb (124.3 kg)   SpO2 98%   BMI 38.22 kg/m  Gen: NAD, alert, cooperative, and pleasant. African American male sitting comfortably CV: RRR, no murmur Resp: CTAB, no wheezes, non-labored Abd: SNTND, BS present, no guarding or organomegaly Ext: No edema, warm Neuro: Alert and oriented, Speech clear, No gross deficits GU: penis with no tenderness, no obvious lesions Skin: Changes consistent with acanthosis on posterior neck  Assessment and plan:  Screen for STD (sexually transmitted disease) Gave extensive counseling on importance of protection during sex. Also counseled on importance of PrEP therapy. Will test for Hep C, Hep B, HIV, Syphilis, HSV 1&2. Will call patient with results and treat accordingly.   Orders Placed This Encounter  Procedures  . Hepatitis C antibody  . Hepatitis B surface antigen  .  HIV antibody (with reflex)  . RPR  . HSV(herpes simplex vrs) 1+2 ab-IgG  . HSV(herpes simplex vrs) 1+2 ab-IgM    No orders of the defined types were placed in this encounter.   Myrene BuddyJacob Chelsia Serres MD PGY-1 Family Medicine Resident 01/17/2018 2:30 PM

## 2018-01-17 NOTE — Assessment & Plan Note (Signed)
Gave extensive counseling on importance of protection during sex. Also counseled on importance of PrEP therapy. Will test for Hep C, Hep B, HIV, Syphilis, HSV 1&2. Will call patient with results and treat accordingly.

## 2018-01-18 LAB — URINE CYTOLOGY ANCILLARY ONLY
CHLAMYDIA, DNA PROBE: NEGATIVE
Neisseria Gonorrhea: NEGATIVE

## 2018-01-19 ENCOUNTER — Telehealth: Payer: Self-pay | Admitting: Family Medicine

## 2018-01-19 LAB — HSV(HERPES SIMPLEX VRS) I + II AB-IGM: HSVI/II COMB AB IGM: 1.15 ratio — AB (ref 0.00–0.90)

## 2018-01-19 LAB — HSV(HERPES SIMPLEX VRS) I + II AB-IGG
HSV 1 Glycoprotein G Ab, IgG: <0.91 index (ref 0.00–0.90)
HSV 2 IgG, Type Spec: <0.91 index (ref 0.00–0.90)

## 2018-01-19 LAB — RPR: RPR Ser Ql: NONREACTIVE

## 2018-01-19 LAB — HEPATITIS B SURFACE ANTIGEN: HEP B S AG: NEGATIVE

## 2018-01-19 LAB — HEPATITIS C ANTIBODY

## 2018-01-19 LAB — HIV ANTIBODY (ROUTINE TESTING W REFLEX): HIV Screen 4th Generation wRfx: NONREACTIVE

## 2018-01-19 NOTE — Telephone Encounter (Signed)
Called to inform patient that he tested negative for hep c, hep b, syphillis, gc, chlamydia, hiv, and HSV IgG. Explained that he tested very slightly positive for HsV IgM. Explained that this means that he has developed short-term antibodies against hsv and that he most likely had a recent exposure in one of his recent sexual encounters. Explained that he would need to come back in around 3 months for additional HsV testing as this sample was so weakly positive. Answered all questions. Patient voiced understanding.  Myrene BuddyJacob Quinzell Malcomb MD PGY-1 Family Medicine Resident

## 2018-02-12 ENCOUNTER — Other Ambulatory Visit: Payer: Self-pay | Admitting: Family Medicine

## 2018-04-19 ENCOUNTER — Ambulatory Visit (INDEPENDENT_AMBULATORY_CARE_PROVIDER_SITE_OTHER): Payer: Medicaid Other | Admitting: Student

## 2018-04-19 ENCOUNTER — Encounter: Payer: Self-pay | Admitting: Student

## 2018-04-19 ENCOUNTER — Other Ambulatory Visit: Payer: Self-pay

## 2018-04-19 VITALS — BP 128/86 | HR 76 | Temp 98.6°F | Ht 71.0 in | Wt 274.8 lb

## 2018-04-19 DIAGNOSIS — Z5181 Encounter for therapeutic drug level monitoring: Secondary | ICD-10-CM

## 2018-04-19 DIAGNOSIS — Z9189 Other specified personal risk factors, not elsewhere classified: Secondary | ICD-10-CM | POA: Diagnosis present

## 2018-04-19 MED ORDER — EMTRICITABINE-TENOFOVIR DF 200-300 MG PO TABS: 1.0000 | ORAL_TABLET | Freq: Every day | ORAL | 4 refills | Status: DC

## 2018-04-19 NOTE — Progress Notes (Signed)
  Subjective:    Aaron Alvarado is a 22 y.o. old male here for refill on Truvada.  HPI  A 22 year old male.  He is sexually active with both men and woman. Reports both oral and anal sex. He is both receptive and penetrates. He is on Truvada for the last 5 months.  Reports good compliance with Truvada. Four sexual partners in the last 6 months but hasb't been sexually active for the last three months. He hasn't been using condom.  Denies fever, skin rash, lumps or bumps, penile discharge, dysuria or sore throat.  He had comprehensive STD check including gonorrhea, chlamydia, RPR, HIV, hepatitis B and hepatitis C which were negative.  He has not had sexual intercourse syndrome.  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  He is HSV-1 and 2 IgG were negative suggesting recent exposure.  He denies any oral or penile lesion.  PMH/Problem List: has Obesity, unspecified; Allergic rhinitis; Historyof GERD; H/O: HTN (hypertension); History of acanthosis skin now resolved 2015; Blount's disease; At risk for HIV due to homosexual contact; and Therapeutic drug monitoring on their problem list.   has a past medical history of Asthma and Hypertension.  FH:  No family history on file.  SH Social History   Tobacco Use  . Smoking status: Never Smoker  . Smokeless tobacco: Never Used  Substance Use Topics  . Alcohol use: No  . Drug use: No    Comment: no marajuana in 6 weeks - 3.5.19    Review of Systems Review of systems negative except for pertinent positives and negatives in history of present illness above.     Objective:     Vitals:   04/19/18 0938  BP: 128/86  Pulse: 76  Temp: 98.6 F (37 C)  TempSrc: Oral  SpO2: 97%  Weight: 274 lb 12.8 oz (124.6 kg)  Height: 5\' 11"  (1.803 m)   Body mass index is 38.33 kg/m.  Physical Exam  GEN: appears well. No apparent distress. Oropharynx: MMM. No erythema.  CVS: RRR, nl s1 & s2, no murmurs RESP: no IWOB GU: deferred today MSK: no  focal tenderness or notable swelling SKIN: no apparent skin lesion NEURO: alert and oiented appropriately, no gross deficits   PSYCH: euthymic mood with congruent affect    Assessment and Plan:  1. At risk for HIV due to homosexual contact: good compliance with Truvada.  Has not been sexually active for the last 3 months.  Comprehensive STD screening negative except for recent exposure to herpes about 3 months ago.  No concerns today.  Refill his Truvada today.  Will check his serum creatinine today. - emtricitabine-tenofovir (TRUVADA) 200-300 MG tablet; Take 1 tablet by mouth daily.  Dispense: 90 tablet; Refill: 4  2. Therapeutic drug monitoring - Basic metabolic panel  Return in about 6 months (around 10/19/2018).  Almon Herculesaye T Gonfa, MD 04/19/18 Pager: [phone]

## 2018-04-19 NOTE — Patient Instructions (Signed)
It was great seeing you today! We refilled your medication.   If we did any lab work today, and the results require attention, either me or my nurse will get in touch with you. If everything is normal, you will get a letter in mail and a message via . If you don't hear from us in two weeks, please give us a call. Otherwise, we look forward to seeing you again at your next visit. If you have any questions or concerns before then, please call the clinic at [phone].  Please bring all your medications to every doctors visit  Sign up for My Chart to have easy access to your labs results, and communication with your Primary care physician.    Please check-out at the front desk before leaving the clinic.    Take Care,   Dr. Alanda SlimGonfa

## 2018-04-20 ENCOUNTER — Encounter: Payer: Self-pay | Admitting: Student

## 2018-04-20 LAB — BASIC METABOLIC PANEL
BUN / CREAT RATIO: 10 (ref 9–20)
BUN: 9 mg/dL (ref 6–20)
CHLORIDE: 104 mmol/L (ref 96–106)
CO2: 24 mmol/L (ref 20–29)
Calcium: 9.3 mg/dL (ref 8.7–10.2)
Creatinine, Ser: 0.89 mg/dL (ref 0.76–1.27)
GFR calc Af Amer: 142 mL/min/{1.73_m2} (ref >59)
GFR calc non Af Amer: 123 mL/min/{1.73_m2} (ref >59)
GLUCOSE: 81 mg/dL (ref 65–99)
Potassium: 3.9 mmol/L (ref 3.5–5.2)
SODIUM: 142 mmol/L (ref 134–144)

## 2018-04-20 NOTE — Progress Notes (Signed)
BMP within normal range. Result letter sent out

## 2018-08-08 ENCOUNTER — Ambulatory Visit: Payer: Medicaid Other | Admitting: Family Medicine

## 2018-09-12 ENCOUNTER — Telehealth: Payer: Self-pay | Admitting: Family Medicine

## 2018-09-12 NOTE — Telephone Encounter (Signed)
Spoke with pt and reminded them of/confirmed their appt for 09/13/18. -CH

## 2018-09-13 ENCOUNTER — Encounter: Payer: Self-pay | Admitting: Family Medicine

## 2018-09-13 ENCOUNTER — Ambulatory Visit: Payer: Medicaid Other | Admitting: Family Medicine

## 2018-09-13 ENCOUNTER — Other Ambulatory Visit: Payer: Self-pay

## 2018-09-13 VITALS — BP 120/84 | HR 63 | Temp 99.0°F | Ht 71.0 in | Wt 279.8 lb

## 2018-09-13 DIAGNOSIS — Z7253 High risk bisexual behavior: Secondary | ICD-10-CM | POA: Diagnosis not present

## 2018-09-13 DIAGNOSIS — J309 Allergic rhinitis, unspecified: Secondary | ICD-10-CM

## 2018-09-13 DIAGNOSIS — Z23 Encounter for immunization: Secondary | ICD-10-CM

## 2018-09-13 MED ORDER — EMTRICITABINE-TENOFOVIR DF 200-300 MG PO TABS: 1.0000 | ORAL_TABLET | Freq: Every day | ORAL | 4 refills | Status: DC

## 2018-09-13 MED FILL — TRUVADA 200-300 MG TABS: 200-300 | 30 days supply | Qty: 30 | Fill #0

## 2018-09-13 NOTE — Assessment & Plan Note (Signed)
I think intermittently he can use cetirizine twice daily as it seems to be improving his symptoms.

## 2018-09-13 NOTE — Assessment & Plan Note (Signed)
Refilled truvada

## 2018-09-13 NOTE — Progress Notes (Signed)
    CHIEF COMPLAINT / HPI:  1.  Sore throat, nasal congestion for the last 2 or 3 days.  Has increased his cetirizine to twice a day and that seems to be helping.  He has had a little cough but mostly nonproductive.  No fever.  No muscle aches. #2.  Some stressors related to school.  Is also having some financial issues.  Long-term he would like to get a masters her PhD in counseling with business minor. #3.  Needs refill on Truvada even though he says he has not been sexually active for about a year.  REVIEW OF SYSTEMS: No unusual weight change.  Energy level is good.  He is sleeping well.  PERTINENT  PMH / PSH: I have reviewed the patient's medications, allergies, past medical and surgical history, smoking status and updated in the EMR as appropriate.   OBJECTIVE:  Vital signs reviewed. GENERAL: Well-developed, well-nourished, no acute distress. CARDIOVASCULAR: Regular rate and rhythm no murmur gallop or rub LUNGS: Clear to auscultation bilaterally, no rales or wheeze. ABDOMEN: Soft positive bowel sounds NEURO: No gross focal neurological deficits. MSK: Movement of extremity x 4. HEENT: Oropharynx is without exudate.  There is mild erythema.  Tonsils are prominent but did not impinge the airway.  Neck is without lymphadenopathy.  It is supple.  Full range of motion of the neck.  TMs bilaterally have good landmarks and are mobile.  Nasal mucosa is slightly boggy.   ASSESSMENT / PLAN:  High risk sexual behavior Refilled truvada  Allergic rhinitis I think intermittently he can use cetirizine twice daily as it seems to be improving his symptoms.   We discussed his stressors.  Very proud of him for work continue to work so hard in school.  Offer him support for his accomplishments.  Viral upper respiratory infection: I think this is mostly resolving.  Is made worse by his underlying allergic rhinitis and he seems to be having exacerbation of that right now as well.  At some point he  might benefit from seeing an allergist.

## 2018-10-15 ENCOUNTER — Other Ambulatory Visit: Payer: Self-pay | Admitting: Family Medicine

## 2018-10-15 DIAGNOSIS — J302 Other seasonal allergic rhinitis: Secondary | ICD-10-CM

## 2019-01-02 ENCOUNTER — Ambulatory Visit: Payer: Medicaid Other | Admitting: Family Medicine

## 2019-01-23 ENCOUNTER — Encounter: Payer: Self-pay | Admitting: Family Medicine

## 2019-01-25 ENCOUNTER — Ambulatory Visit (INDEPENDENT_AMBULATORY_CARE_PROVIDER_SITE_OTHER): Payer: Self-pay | Admitting: Family Medicine

## 2019-01-25 ENCOUNTER — Encounter: Payer: Self-pay | Admitting: Family Medicine

## 2019-01-25 ENCOUNTER — Other Ambulatory Visit: Payer: Self-pay

## 2019-01-25 VITALS — BP 137/88 | HR 96 | Temp 98.5°F | Resp 17 | Ht 71.0 in | Wt 265.8 lb

## 2019-01-25 DIAGNOSIS — Z113 Encounter for screening for infections with a predominantly sexual mode of transmission: Secondary | ICD-10-CM

## 2019-01-25 DIAGNOSIS — Z1389 Encounter for screening for other disorder: Secondary | ICD-10-CM

## 2019-01-25 DIAGNOSIS — Z23 Encounter for immunization: Secondary | ICD-10-CM

## 2019-01-25 DIAGNOSIS — Z7689 Persons encountering health services in other specified circumstances: Secondary | ICD-10-CM

## 2019-01-25 DIAGNOSIS — Z0000 Encounter for general adult medical examination without abnormal findings: Secondary | ICD-10-CM

## 2019-01-25 LAB — POCT URINALYSIS DIP (CLINITEK)
Glucose, UA: NEGATIVE mg/dL
LEUKOCYTES UA: NEGATIVE
Nitrite, UA: NEGATIVE
POC PROTEIN,UA: 100 — AB
Urobilinogen, UA: 0.2 E.U./dL
pH, UA: 6 (ref 5.0–8.0)

## 2019-01-25 NOTE — Progress Notes (Signed)
Patient ID: Aaron Alvarado, male    DOB: [birth year], 23 y.o.   MRN: [id number]  PCP: Bing Neighbors, FNP  Chief Complaint  Patient presents with  . Establish Care  . Annual Exam    Subjective:  HPI Aaron Alvarado is a 23 y.o. male,former cannibis smoker, recently quit, presents for complete physical exam.  Chronic conditions include: Lucion R Halsted has Obesity, unspecified; Allergic rhinitis; Historyof GERD; H/O: HTN (hypertension); History of acanthosis skin now resolved 2015; Blount's disease; High risk sexual behavior; and Therapeutic drug monitoring on their problem list.    Routine physical activity: no routine physical activity.Current BMI: Body mass index is 37.07 kg/m. Dietary habits: Decreased appetite.  Patient continues to engage in high risk sexual behaviors with both males and females. He reports no use of condoms. He reports a scare last year when exposed to herpes, however was told he did not have herpes after testing for HSV indicated positive exposure and follow-up was negative for IgG antibodies. He has not been retested since that time. He denies any lesions. He is currently taking Truvada for PrEP therapy and reports compliance. Last hep B and HIV tests were negative.  Health Screening Current/Overdue:   Immunizations: TDAP  Last Dental Exam: no recent dental exam  Last Eye Exam: June 2020      Current home medications include: Prior to Admission medications   Medication Sig Start Date End Date Taking? Authorizing Provider  cetirizine (ZYRTEC) 10 MG tablet TAKE 1 TABLET BY MOUTH EVERY DAY 10/18/18  Yes Nestor Ramp, MD  emtricitabine-tenofovir (TRUVADA) 200-300 MG tablet Take 1 tablet by mouth daily. 09/13/18  Yes Nestor Ramp, MD  fluticasone [facility]) 50 MCG/ACT nasal spray SPRAY 2 SPRAYS INTO EACH NOSTRIL EVERY DAY 10/18/18  Yes Nestor Ramp, MD  PROAIR HFA [phone] [place]) MCG/ACT inhaler INHALE 2-4 PUFFS INTO THE LUNGS AS NEEDED FOR WHEEZING 02/13/18  Yes Nestor Ramp, MD    No family history on file.  Allergies  Allergen Reactions  . Penicillins Hives and Swelling    Social History   Socioeconomic History  . Marital status: Single    Spouse name: Not on file  . Number of children: Not on file  . Years of education: Not on file  . Highest education level: Not on file  Occupational History  . Not on file  Social Needs  . Financial resource strain: Not on file  . Food insecurity:    Worry: Not on file    Inability: Not on file  . Transportation needs:    Medical: Not on file    Non-medical: Not on file  Tobacco Use  . Smoking status: Never Smoker  . Smokeless tobacco: Never Used  Substance and Sexual Activity  . Alcohol use: No  . Drug use: No    Comment: no marajuana in 6 weeks - 3.5.19  . Sexual activity: Yes    Partners: Female, Male    Birth control/protection: Condom    Comment: PrEP  Lifestyle  . Physical activity:    Days per week: Not on file    Minutes per session: Not on file  . Stress: Not on file  Relationships  . Social connections:    Talks on phone: Not on file    Gets together: Not on file    Attends religious service: Not on file    Active member of club or organization: Not on file    Attends meetings of  clubs or organizations: Not on file    Relationship status: Not on file  . Intimate partner violence:    Fear of current or ex partner: Not on file    Emotionally abused: Not on file    Physically abused: Not on file    Forced sexual activity: Not on file  Other Topics Concern  . Not on file  Social History Narrative  . Not on file   Review of Systems Pertinent negatives listed in HPI Past Medical, Surgical Family and Social History reviewed and updated.  Objective:   Today's Vitals   01/25/19 1352  BP: 137/88  Pulse: 96  Resp: 17  Temp: 98.5 F (36.9 C)  TempSrc: Oral  SpO2: 95%  Weight: 265 lb 12.8 oz (120.6 kg)  Height: 5\' 11"  (1.803 m)    Wt Readings from Last 3 Encounters:   01/25/19 265 lb 12.8 oz (120.6 kg)  09/13/18 279 lb 12.8 oz (126.9 kg)  04/19/18 274 lb 12.8 oz (124.6 kg)   Physical Exam Constitutional: Patient appears well-developed and well-nourished. No distress. HENT: Normocephalic, atraumatic, External right and left ear normal. Oropharynx is clear and moist.  Eyes: Conjunctivae and EOM are normal. PERRLA, no scleral icterus. Neck: Normal ROM. Neck supple. No JVD. No tracheal deviation. No thyromegaly. CVS: RRR, S1/S2 +, no murmurs, no gallops, no carotid bruit.  Pulmonary: Effort and breath sounds normal, no stridor, rhonchi, wheezes, rales.  Abdominal: Soft. BS +, no distension, tenderness, rebound or guarding.  Musculoskeletal: Normal range of motion. No edema and no tenderness.  Neuro: Alert. Normal reflexes, muscle tone coordination. No cranial nerve deficit. Skin: Skin is warm and dry. No rash noted. Not diaphoretic. No erythema. No pallor. Psychiatric: Normal mood and affect. Behavior, judgment, thought content normal.  Assessment & Plan:  1. Encounter to establish care 2. Annual physical exam -Age-appropriate anticipatory   3. Screening for blood or protein in urine - POCT URINALYSIS DIP (CLINITEK)  4. Need for Tdap vaccination - Tdap vaccine greater than or equal to 7yo IM  5. Screen for STD (sexually transmitted disease) Encouraged barrier protection to reduce risk of occurring STD. - GC/Chlamydia Probe Amp([facility]); Future - RPR - HIV antibody (with reflex)  6. Laboratory tests ordered as part of a complete physical exam (CPE) - Hemoglobin A1c - Comprehensive metabolic panel - CBC with Differential - Lipid panel   Joaquin Courts, FNP-C [facility] [address] [place] [address] [phone]: [phone]

## 2019-01-25 NOTE — Patient Instructions (Addendum)
Thank you for choosing Primary Care at [facility] to be your medical home!    Aaron Alvarado was seen by Joaquin Courts, FNP today.   Aaron Alvarado's primary care provider is Bing Neighbors, FNP.   For the best care possible, you should try to see Joaquin Courts, FNP-C whenever you come to the clinic.   We look forward to seeing you again soon!  If you have any questions about your visit today, please call us at [phone] or feel free to reach your primary care provider via MyChart.     Safe Sex Practicing safe sex means taking steps before and during sex to reduce your risk of:  Getting an STD (sexually transmitted disease).  Giving your partner an STD.  Unwanted pregnancy. How can I practice safe sex? To practice safe sex:  Limit your sexual partners to only one partner who is having sex with only you.  Avoid using alcohol and recreational drugs before having sex. These substances can affect your judgment.  Before having sex with a new partner: ? Talk to your partner about past partners, past STDs, and drug use. ? You and your partner should be screened for STDs and discuss the results with each other.  Check your body regularly for sores, blisters, rashes, or unusual discharge. If you notice any of these problems, visit your health care provider.  If you have symptoms of an infection or you are being treated for an STD, avoid sexual contact.  While having sex, use a condom. Make sure to: ? Use a condom every time you have vaginal, oral, or anal sex. Both females and males should wear condoms during oral sex. ? Keep condoms in place from the beginning to the end of sexual activity. ? Use a latex condom, if possible. Latex condoms offer the best protection. ? Use only water-based lubricants or oils to lubricate a condom. Using petroleum-based lubricants or oils will weaken the condom and increase the chance that it will break.  See your health care provider for  regular screenings, exams, and tests for STDs.  Talk with your health care provider about the form of birth control (contraception) that is best for you.  Get vaccinated against hepatitis B and human papillomavirus (HPV).  If you are at risk of being infected with HIV (human immunodeficiency virus), talk with your health care provider about taking a prescription medicine to prevent HIV infection. You are considered at risk for HIV if: ? You are a man who has sex with other men. ? You are a heterosexual man or woman who is sexually active with more than one partner. ? You take drugs by injection. ? You are sexually active with a partner who has HIV. This information is not intended to replace advice given to you by your health care provider. Make sure you discuss any questions you have with your health care provider. Document Released: 12/09/2004 Document Revised: 03/17/2016 Document Reviewed: 09/21/2015 Elsevier Interactive Patient Education  2019 [employer].    Keeping you healthy  Get these tests  Blood pressure- Have your blood pressure checked once a year by your healthcare provider.  Normal blood pressure is 120/80.  Weight- Have your body mass index (BMI) calculated to screen for obesity.  BMI is a measure of body fat based on height and weight. You can also calculate your own BMI at https://www.west-esparza.com/.  Cholesterol- Have your cholesterol checked regularly starting at age 45, sooner may be necessary if you  have diabetes, high blood pressure, if a family member developed heart diseases at an early age or if you smoke.   Chlamydia, HIV, and other sexual transmitted disease- Get screened each year until the age of 66 then within three months of each new sexual partner.  Diabetes- Have your blood sugar checked regularly if you have high blood pressure, high cholesterol, a family history of diabetes or if you are overweight.  Get these vaccines  Flu shot- Every  fall.  Tetanus shot- Every 10 years.  Menactra- Single dose; prevents meningitis.  Take these steps  Don't smoke- If you do smoke, ask your healthcare provider about quitting. For tips on how to quit, go to www.smokefree.gov or call 1-800-QUIT-NOW.  Be physically active- Exercise 5 days a week for at least 30 minutes.  If you are not already physically active start slow and gradually work up to 30 minutes of moderate physical activity.  Examples of moderate activity include walking briskly, mowing the yard, dancing, swimming bicycling, etc.  Eat a healthy diet- Eat a variety of healthy foods such as fruits, vegetables, low fat milk, low fat cheese, yogurt, lean meats, poultry, fish, beans, tofu, etc.  For more information on healthy eating, go to www.thenutritionsource.org  Drink alcohol in moderation- Limit alcohol intake two drinks or less a day.  Never drink and drive.  Dentist- Brush and floss teeth twice daily; visit your dentis twice a year.  Depression-Your emotional health is as important as your physical health.  If you're feeling down, losing interest in things you normally enjoy please talk with your healthcare provider.  Gun Safety- If you keep a gun in your home, keep it unloaded and with the safety lock on.  Bullets should be stored separately.  Helmet use- Always wear a helmet when riding a motorcycle, bicycle, rollerblading or skateboarding.  Safe sex- If you may be exposed to a sexually transmitted infection, use a condom  Seat belts- Seat bels can save your life; always wear one.  Smoke/Carbon Monoxide detectors- These detectors need to be installed on the appropriate level of your home.  Replace batteries at least once a year.  Skin Cancer- When out in the sun, cover up and use sunscreen SPF 15 or higher.  Violence- If anyone is threatening or hurting you, please tell your healthcare provider.     Genital Herpes Genital herpes is a common sexually transmitted  infection (STI) that is caused by a virus. The virus spreads from person to person through sexual contact. Infection can cause itching, blisters, and sores around the genitals or rectum. Symptoms may last several days and then go away This is called an outbreak. However, the virus remains in your body, so you may have more outbreaks in the future. The time between outbreaks varies and can be months or years. Genital herpes affects men and women. It is particularly concerning for pregnant women because the virus can be passed to the baby during delivery and can cause serious problems. Genital herpes is also a concern for people who have a weak disease-fighting (immune) system. What are the causes? This condition is caused by the herpes simplex virus (HSV) type 1 or type 2. The virus may spread through:  Sexual contact with an infected person, including vaginal, anal, and oral sex.  Contact with fluid from a herpes sore.  The skin. This means that you can get herpes from an infected partner even if he or she does not have a visible sore or  does not know that he or she is infected. What increases the risk? You are more likely to develop this condition if:  You have sex with many partners.  You do not use latex condoms during sex. What are the signs or symptoms? Most people do not have symptoms (asymptomatic) or have mild symptoms that may be mistaken for other skin problems. Symptoms may include:  Small red bumps near the genitals, rectum, or mouth. These bumps turn into blisters and then turn into sores.  Flu-like symptoms, including: ? Fever. ? Body aches. ? Swollen lymph nodes. ? Headache.  Painful urination.  Pain and itching in the genital area or rectal area.  Vaginal discharge.  Tingling or shooting pain in the legs and buttocks. Generally, symptoms are more severe and last longer during the first (primary) outbreak. Flu-like symptoms are also more common during the primary  outbreak. How is this diagnosed? Genital herpes may be diagnosed based on:  A physical exam.  Your medical history.  Blood tests.  A test of a fluid sample (culture) from an open sore. How is this treated? There is no cure for this condition, but treatment with antiviral medicines that are taken by mouth (orally) can do the following:  Speed up healing and relieve symptoms.  Help to reduce the spread of the virus to sexual partners.  Limit the chance of future outbreaks, or make future outbreaks shorter.  Lessen symptoms of future outbreaks. Your health care provider may also recommend pain relief medicines, such as aspirin or ibuprofen. Follow these instructions at home: Sexual activity  Do not have sexual contact during active outbreaks.  Practice safe sex. Latex condoms and male condoms may help prevent the spread of the herpes virus. General instructions  Keep the affected areas dry and clean.  Take over-the-counter and prescription medicines only as told by your health care provider.  Avoid rubbing or touching blisters and sores. If you do touch blisters or sores: ? Wash your hands thoroughly with soap and water. ? Do not touch your eyes afterward.  To help relieve pain or itching, you may take the following actions as directed by your health care provider: ? Apply a cold, wet cloth (cold compress) to affected areas 4-6 times a day. ? Apply a substance that protects your skin and reduces bleeding (astringent). ? Apply a gel that helps relieve pain around sores (lidocaine gel). ? Take a warm, shallow bath that cleans the genital area (sitz bath).  Keep all follow-up visits as told by your health care provider. This is important. How is this prevented?  Use condoms. Although anyone can get genital herpes during sexual contact, even with the use of a condom, a condom can provide some protection.  Avoid having multiple sexual partners.  Talk with your sexual  partner about any symptoms either of you may have. Also, talk with your partner about any history of STIs.  Get tested for STIs before you have sex. Ask your partner to do the same.  Do not have sexual contact if you have symptoms of genital herpes. Contact a health care provider if:  Your symptoms are not improving with medicine.  Your symptoms return.  You have new symptoms.  You have a fever.  You have abdominal pain.  You have redness, swelling, or pain in your eye.  You notice new sores on other parts of your body.  You are a woman and experience bleeding between menstrual periods.  You have had herpes and  you become pregnant or plan to become pregnant. Summary  Genital herpes is a common sexually transmitted infection (STI) that is caused by the herpes simplex virus (HSV) type 1 or type 2.  These viruses are most often spread through sexual contact with an infected person.  You are more likely to develop this condition if you have sex with many partners or you have unprotected sex.  Most people do not have symptoms (asymptomatic) or have mild symptoms that may be mistaken for other skin problems. Symptoms occur as outbreaks that may happen months or years apart.  There is no cure for this condition, but treatment with oral antiviral medicines can reduce symptoms, reduce the chance of spreading the virus to a partner, prevent future outbreaks, or shorten future outbreaks. This information is not intended to replace advice given to you by your health care provider. Make sure you discuss any questions you have with your health care provider. Document Released: 10/29/2000 Document Revised: 10/01/2016 Document Reviewed: 10/01/2016 Elsevier Interactive Patient Education  2019 [employer].

## 2019-01-26 LAB — CBC WITH DIFFERENTIAL/PLATELET
Basophils Absolute: 0 10*3/uL (ref 0.0–0.2)
Basos: 1 %
EOS (ABSOLUTE): 0 10*3/uL (ref 0.0–0.4)
Eos: 0 %
HEMATOCRIT: 44.7 % (ref 37.5–51.0)
Hemoglobin: 14.8 g/dL (ref 13.0–17.7)
Immature Grans (Abs): 0 10*3/uL (ref 0.0–0.1)
Immature Granulocytes: 0 %
Lymphocytes Absolute: 2.5 10*3/uL (ref 0.7–3.1)
Lymphs: 40 %
MCH: 27.8 pg (ref 26.6–33.0)
MCHC: 33.1 g/dL (ref 31.5–35.7)
MCV: 84 fL (ref 79–97)
Monocytes Absolute: 0.5 10*3/uL (ref 0.1–0.9)
Monocytes: 8 %
Neutrophils Absolute: 3.2 10*3/uL (ref 1.4–7.0)
Neutrophils: 51 %
Platelets: 290 10*3/uL (ref 150–450)
RBC: 5.33 x10E6/uL (ref 4.14–5.80)
RDW: 13.1 % (ref 11.6–15.4)
WBC: 6.2 10*3/uL (ref 3.4–10.8)

## 2019-01-26 LAB — COMPREHENSIVE METABOLIC PANEL
ALT: 20 IU/L (ref 0–44)
AST: 25 IU/L (ref 0–40)
Albumin/Globulin Ratio: 1.7 (ref 1.2–2.2)
Albumin: 5 g/dL (ref 4.1–5.2)
Alkaline Phosphatase: 78 IU/L (ref 39–117)
BUN/Creatinine Ratio: 14 (ref 9–20)
BUN: 13 mg/dL (ref 6–20)
Bilirubin Total: 1.1 mg/dL (ref 0.0–1.2)
CO2: 19 mmol/L — ABNORMAL LOW (ref 20–29)
Calcium: 10.2 mg/dL (ref 8.7–10.2)
Chloride: 104 mmol/L (ref 96–106)
Creatinine, Ser: 0.91 mg/dL (ref 0.76–1.27)
GFR calc Af Amer: 139 mL/min/{1.73_m2} (ref >59)
GFR calc non Af Amer: 120 mL/min/{1.73_m2} (ref >59)
Globulin, Total: 3 g/dL (ref 1.5–4.5)
Glucose: 112 mg/dL — ABNORMAL HIGH (ref 65–99)
Potassium: 3.5 mmol/L (ref 3.5–5.2)
Sodium: 142 mmol/L (ref 134–144)
Total Protein: 8 g/dL (ref 6.0–8.5)

## 2019-01-26 LAB — HSV(HERPES SIMPLEX VRS) I + II AB-IGG
HSV 1 Glycoprotein G Ab, IgG: <0.91 index (ref 0.00–0.90)
HSV 2 IgG, Type Spec: 1.19 index — ABNORMAL HIGH (ref 0.00–0.90)

## 2019-01-26 LAB — LIPID PANEL
Chol/HDL Ratio: 4.3 ratio (ref 0.0–5.0)
Cholesterol, Total: 164 mg/dL (ref 100–199)
HDL: 38 mg/dL — ABNORMAL LOW (ref >39)
LDL Calculated: 106 mg/dL — ABNORMAL HIGH (ref 0–99)
TRIGLYCERIDES: 98 mg/dL (ref 0–149)
VLDL Cholesterol Cal: 20 mg/dL (ref 5–40)

## 2019-01-26 LAB — HSV-2 IGG SUPPLEMENTAL TEST: HSV-2 IgG Supplemental Test: NEGATIVE

## 2019-01-26 LAB — HIV ANTIBODY (ROUTINE TESTING W REFLEX): HIV Screen 4th Generation wRfx: NONREACTIVE

## 2019-01-26 LAB — RPR: RPR: NONREACTIVE

## 2019-01-26 LAB — HEMOGLOBIN A1C
ESTIMATED AVERAGE GLUCOSE: 111 mg/dL
Hgb A1c MFr Bld: 5.5 % (ref 4.8–5.6)

## 2019-01-29 LAB — GC/CHLAMYDIA PROBE AMP
Chlamydia trachomatis, NAA: NEGATIVE
Neisseria gonorrhoeae by PCR: NEGATIVE

## 2019-01-30 NOTE — Progress Notes (Signed)
Patient notified of results & recommendations. Expressed understanding.

## 2019-04-24 ENCOUNTER — Encounter: Payer: Self-pay | Admitting: Emergency Medicine

## 2019-04-24 ENCOUNTER — Other Ambulatory Visit: Payer: Self-pay

## 2019-04-24 ENCOUNTER — Ambulatory Visit
Admission: EM | Admit: 2019-04-24 | Discharge: 2019-04-24 | Disposition: A | Discharge status: Home / Self Care | Payer: Medicaid Other | Attending: Physician Assistant | Admitting: Physician Assistant

## 2019-04-24 DIAGNOSIS — S80862A Insect bite (nonvenomous), left lower leg, initial encounter: Secondary | ICD-10-CM

## 2019-04-24 DIAGNOSIS — I10 Essential (primary) hypertension: Secondary | ICD-10-CM

## 2019-04-24 DIAGNOSIS — W57XXXA Bitten or stung by nonvenomous insect and other nonvenomous arthropods, initial encounter: Secondary | ICD-10-CM

## 2019-04-24 MED ORDER — MUPIROCIN 2 % EX OINT: 1.0000 "application " | TOPICAL_OINTMENT | Freq: Two times a day (BID) | CUTANEOUS | 0 refills | Status: DC

## 2019-04-24 NOTE — ED Triage Notes (Signed)
Pt presents to [facility] for assessment of bug bite to left calf.  Area is red, swollen and warm to touch.

## 2019-04-24 NOTE — ED Notes (Signed)
Patient able to ambulate independently  

## 2019-04-24 NOTE — Discharge Instructions (Signed)
No infection at this time. Redness and swelling/warmth most likely due to inflammation. Bactroban as directed, this will help prevent infection as it is an antibiotic ointment. Warm compress. Monitor for spreading redness, increased warmth, fever. I will be calling you on Friday to check up on you and see if we need to start other medicines.

## 2019-04-24 NOTE — ED Provider Notes (Signed)
[facility]    CSN: [id number] Arrival date & time: 04/24/19  1458     History   Chief Complaint Chief Complaint  Patient presents with  . Insect Bite    HPI Lethea Killingsevin R Randa is a 23 y.o. male.   23 year old male comes in for 1 day history of insect bite to the left lower extremity.  States was walking in the woods yesterday, woke up this morning with redness and swelling to the left lateral lower extremity.  Area is tender to palpation.  He denies any known tick bite.  Denies spreading erythema.  Denies fever, chills, night sweats.  Has not tried anything for the symptoms.     Past Medical History:  Diagnosis Date  . Asthma   . Blount's disease   . Environmental allergies   . Hypertension   . PTSD (post-traumatic stress disorder)     Patient Active Problem List   Diagnosis Date Noted  . Therapeutic drug monitoring 01/17/2018  . High risk sexual behavior 10/26/2017  . 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 H/O: HTN (hypertension) 10/26/2011  . History of acanthosis skin now resolved 2015 10/26/2011  . Historyof GERD 05/06/2011  . Obesity, unspecified 04/10/2010  . Allergic rhinitis 01/12/2007    Past Surgical History:  Procedure Laterality Date  . KNEE ARTHROSCOPY  2011       Home Medications    Prior to Admission medications   Medication Sig Start Date End Date Taking? Authorizing Provider  cetirizine (ZYRTEC) 10 MG tablet TAKE 1 TABLET BY MOUTH EVERY DAY 10/18/18  Yes Nestor RampNeal, Sara L, MD  PROAIR HFA 108 [phone](90 Base) MCG/ACT inhaler INHALE 2-4 PUFFS INTO THE LUNGS AS NEEDED FOR WHEEZING 02/13/18  Yes Nestor RampNeal, Sara L, MD  emtricitabine-tenofovir (TRUVADA) 200-300 MG tablet Take 1 tablet by mouth daily. 09/13/18   Nestor RampNeal, Sara L, MD  fluticasone [facility](FLONASE) 50 MCG/ACT nasal spray SPRAY 2 SPRAYS INTO EACH NOSTRIL EVERY DAY 10/18/18   Nestor RampNeal, Sara L, MD  mupirocin ointment (BACTROBAN) 2 % Apply 1 application topically 2 (two) times daily. 04/24/19   Belinda FisherYu, May Manrique V, PA-C    Family  History Family History  Problem Relation Age of Onset  . Hypertension Mother   . Fibromyalgia Mother     Social History Social History   Tobacco Use  . Smoking status: Never Smoker  . Smokeless tobacco: Never Used  Substance Use Topics  . Alcohol use: No  . Drug use: No    Comment: no marajuana in 6 weeks - 3.5.19     Allergies   Penicillins   Review of Systems Review of Systems  Reason unable to perform ROS: See HPI as above.     Physical Exam Triage Vital Signs ED Triage Vitals  Enc Vitals Group     BP 04/24/19 1509 (!) 159/99     Pulse Rate 04/24/19 1509 73     Resp 04/24/19 1509 16     Temp 04/24/19 1509 98.1 F (36.7 C)     Temp Source 04/24/19 1509 Oral     SpO2 04/24/19 1509 99 %     Weight --      Height --      Head Circumference --      Peak Flow --      Pain Score 04/24/19 1505 7     Pain Loc --      Pain Edu? --      Excl. in GC? --    No data found.  Updated Vital Signs BP (!) 159/99 (BP Location: Right Arm)   Pulse 73   Temp 98.1 F (36.7 C) (Oral)   Resp 16   SpO2 99%   Physical Exam Constitutional:      General: He is not in acute distress.    Appearance: He is well-developed. He is not diaphoretic.  HENT:     Head: Normocephalic and atraumatic.  Eyes:     Conjunctiva/sclera: Conjunctivae normal.     Pupils: Pupils are equal, round, and reactive to light.  Musculoskeletal:     Comments: Pinpoint scab to the left lateral lower extremity with about 2cm surrounding erythema with warmth. Tender to palpation. No induration, fluctuance.  Neurological:     Mental Status: He is alert and oriented to person, place, and time.      UC Treatments / Results  Labs (all labs ordered are listed, but only abnormal results are displayed) Labs Reviewed - No data to display  EKG None  Radiology No results found.  Procedures Procedures (including critical care time)  Medications Ordered in UC Medications - No data to display   Initial Impression / Assessment and Plan / UC Course  I have reviewed the triage vital signs and the nursing notes.  Pertinent labs & imaging results that were available during my care of the patient were reviewed by me and considered in my medical decision making (see chart for details).    Discussed symptoms most likely due to inflammation versus infection at this time.  Will provide Bactroban, and have patient do warm compresses.  Will be in contact with patient in 2 to 3 days to recheck symptoms.  Return precautions given.  Patient expresses understanding and agrees to plan.  Final Clinical Impressions(s) / UC Diagnoses   Final diagnoses:  Insect bite of left lower leg, initial encounter    ED Prescriptions    Medication Sig Dispense Auth. Provider   mupirocin ointment (BACTROBAN) 2 % Apply 1 application topically 2 (two) times daily. 22 g Tobin Chad, [place] 04/24/19 1541

## 2019-04-26 ENCOUNTER — Other Ambulatory Visit: Payer: Self-pay

## 2019-04-26 ENCOUNTER — Ambulatory Visit
Admission: EM | Admit: 2019-04-26 | Discharge: 2019-04-26 | Disposition: A | Discharge status: Home / Self Care | Payer: Medicaid Other | Attending: Family Medicine | Admitting: Family Medicine

## 2019-04-26 DIAGNOSIS — L03119 Cellulitis of unspecified part of limb: Secondary | ICD-10-CM

## 2019-04-26 DIAGNOSIS — B9689 Other specified bacterial agents as the cause of diseases classified elsewhere: Secondary | ICD-10-CM

## 2019-04-26 DIAGNOSIS — I10 Essential (primary) hypertension: Secondary | ICD-10-CM

## 2019-04-26 DIAGNOSIS — L02419 Cutaneous abscess of limb, unspecified: Secondary | ICD-10-CM

## 2019-04-26 MED ORDER — CEFTRIAXONE SODIUM 1 G IJ SOLR
1.0000 g | Freq: Once | INTRAMUSCULAR | Status: AC
  Administered 2019-04-26: 1 g via INTRAMUSCULAR

## 2019-04-26 MED ORDER — KETOROLAC TROMETHAMINE 30 MG/ML IJ SOLN
30.0000 mg | Freq: Once | INTRAMUSCULAR | Status: AC
  Administered 2019-04-26: 30 mg via INTRAMUSCULAR

## 2019-04-26 MED ORDER — DOXYCYCLINE HYCLATE 100 MG PO CAPS: 100.0000 mg | ORAL_CAPSULE | Freq: Two times a day (BID) | ORAL | 0 refills | Status: DC

## 2019-04-26 MED ORDER — LIDOCAINE HCL (PF) 1 % IJ SOLN
2.0000 mL | Freq: Once | INTRAMUSCULAR | Status: AC
  Administered 2019-04-26: 2 mL

## 2019-04-26 NOTE — Discharge Instructions (Signed)
Treating you for a cellulitis infection of the leg Antibiotic injection and pain medicine given here Sending some doxycycline to the pharmacy for further treatment If your symptoms continue or worsen over the next couple days you will need to be re-seen or go to the hospital

## 2019-04-26 NOTE — ED Triage Notes (Signed)
Pt c/o wound to lt lower leg since Monday. Was seen and tx'd here on Tuesday, worse today with drainage.

## 2019-04-26 NOTE — ED Provider Notes (Signed)
[facility]    CSN: [id number] Arrival date & time: 04/26/19  1001     History   Chief Complaint Chief Complaint  Patient presents with  . Wound Check    HPI LATAVION HALLS is a 23 y.o. male.   Patient is a 23 year old male who presents today with possible infection to left lower leg.  This is been over the past 4 to 5 days.  He was seen here a few days ago and treated with Bactroban ointment.  The condition has worsened.  He has had more spreading of redness.  He has low-grade fever here today.  There has been some drainage from the leg.  Denies any body aches, chills, night sweats, nausea or vomiting.  Unknown of sores or bug bites.  ROS per HPI      Past Medical History:  Diagnosis Date  . Asthma   . Blount's disease   . Environmental allergies   . Hypertension   . PTSD (post-traumatic stress disorder)     Patient Active Problem List   Diagnosis Date Noted  . Therapeutic drug monitoring 01/17/2018  . High risk sexual behavior 10/26/2017  . 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 H/O: HTN (hypertension) 10/26/2011  . History of acanthosis skin now resolved 2015 10/26/2011  . Historyof GERD 05/06/2011  . Obesity, unspecified 04/10/2010  . Allergic rhinitis 01/12/2007    Past Surgical History:  Procedure Laterality Date  . KNEE ARTHROSCOPY  2011       Home Medications    Prior to Admission medications   Medication Sig Start Date End Date Taking? Authorizing Provider  cetirizine (ZYRTEC) 10 MG tablet TAKE 1 TABLET BY MOUTH EVERY DAY 10/18/18   Dickie La, MD  doxycycline (VIBRAMYCIN) 100 MG capsule Take 1 capsule (100 mg total) by mouth 2 (two) times daily. 04/26/19   Shandra Szymborski, Tressia Miners A, NP  emtricitabine-tenofovir (TRUVADA) 200-300 MG tablet Take 1 tablet by mouth daily. 09/13/18   Dickie La, MD  fluticasone [facility]) 50 MCG/ACT nasal spray SPRAY 2 SPRAYS INTO EACH NOSTRIL EVERY DAY 10/18/18   Dickie La, MD  mupirocin ointment (BACTROBAN) 2 % Apply  1 application topically 2 (two) times daily. 04/24/19   Ok Edwards, PA-C  PROAIR HFA 108 [phone]) MCG/ACT inhaler INHALE 2-4 PUFFS INTO THE LUNGS AS NEEDED FOR WHEEZING 02/13/18   Dickie La, MD    Family History Family History  Problem Relation Age of Onset  . Hypertension Mother   . Fibromyalgia Mother     Social History Social History   Tobacco Use  . Smoking status: Never Smoker  . Smokeless tobacco: Never Used  Substance Use Topics  . Alcohol use: No  . Drug use: No    Comment: no marajuana in 6 weeks - 3.5.19     Allergies   Penicillins   Review of Systems Review of Systems   Physical Exam Triage Vital Signs ED Triage Vitals [04/26/19 1013]  Enc Vitals Group     BP (!) 162/101     Pulse Rate 87     Resp 18     Temp 99 F (37.2 C)     Temp Source Oral     SpO2 98 %     Weight      Height      Head Circumference      Peak Flow      Pain Score 8     Pain Loc  Pain Edu?      Excl. in GC?    No data found.  Updated Vital Signs BP (!) 162/101 (BP Location: Left Arm)   Pulse 87   Temp 99 F (37.2 C) (Oral)   Resp 18   SpO2 98%   Visual Acuity Right Eye Distance:   Left Eye Distance:   Bilateral Distance:    Right Eye Near:   Left Eye Near:    Bilateral Near:     Physical Exam Vitals signs and nursing note reviewed.  Constitutional:      General: He is not in acute distress.    Appearance: Normal appearance. He is not ill-appearing, toxic-appearing or diaphoretic.  HENT:     Head: Normocephalic and atraumatic.     Nose: Nose normal.  Eyes:     Conjunctiva/sclera: Conjunctivae normal.  Neck:     Musculoskeletal: Normal range of motion.  Pulmonary:     Effort: Pulmonary effort is normal.  Musculoskeletal: Normal range of motion.  Skin:    General: Skin is warm and dry.          Comments: Cellulitis to the LLE with centered opening and drainage.   Neurological:     Mental Status: He is alert.  Psychiatric:        Mood and  Affect: Mood normal.      UC Treatments / Results  Labs (all labs ordered are listed, but only abnormal results are displayed) Labs Reviewed - No data to display  EKG None  Radiology No results found.  Procedures Procedures (including critical care time)  Medications Ordered in UC Medications  cefTRIAXone (ROCEPHIN) injection 1 g (1 g Intramuscular Given 04/26/19 1034)  ketorolac (TORADOL) 30 MG/ML injection 30 mg (30 mg Intramuscular Given 04/26/19 1034)    Initial Impression / Assessment and Plan / UC Course  I have reviewed the triage vital signs and the nursing notes.  Pertinent labs & imaging results that were available during my care of the patient were reviewed by me and considered in my medical decision making (see chart for details).     Cellulitis to the left lower extremity with purulent drainage Patient has fever here today Area warm to touch We will go ahead and cover with Rocephin here in clinic and Toradol for pain Sending doxycycline to the pharmacy for further antibiotic coverage Work note given Instructed to closely monitor the infection and if the infection worsens despite treatment he will need to be re-seen or go to the hospital. Pt understanding.  Final Clinical Impressions(s) / UC Diagnoses   Final diagnoses:  Cellulitis and abscess of leg     Discharge Instructions     Treating you for a cellulitis infection of the leg Antibiotic injection and pain medicine given here Sending some doxycycline to the pharmacy for further treatment If your symptoms continue or worsen over the next couple days you will need to be re-seen or go to the hospital    ED Prescriptions    Medication Sig Dispense Auth. Provider   doxycycline (VIBRAMYCIN) 100 MG capsule Take 1 capsule (100 mg total) by mouth 2 (two) times daily. 20 capsule Dahlia ByesBast, Trumaine Wimer A, NP     Controlled Substance Prescriptions [place] Controlled Substance Registry consulted? Not Applicable    Janace ArisBast, Cheyan Frees A, NP 04/26/19 1038

## 2019-05-01 ENCOUNTER — Telehealth: Payer: Self-pay

## 2019-05-01 NOTE — Telephone Encounter (Signed)
Called patient to do their pre-visit COVID screening.  Call went straight to voicemail. Unable to do prescreening.

## 2019-05-02 ENCOUNTER — Encounter: Payer: Self-pay | Admitting: Family Medicine

## 2019-05-02 ENCOUNTER — Ambulatory Visit: Payer: Medicaid Other | Admitting: Family Medicine

## 2019-05-02 ENCOUNTER — Telehealth (INDEPENDENT_AMBULATORY_CARE_PROVIDER_SITE_OTHER): Payer: Medicaid Other | Admitting: Family Medicine

## 2019-05-02 DIAGNOSIS — Z113 Encounter for screening for infections with a predominantly sexual mode of transmission: Secondary | ICD-10-CM

## 2019-05-02 DIAGNOSIS — L0390 Cellulitis, unspecified: Secondary | ICD-10-CM

## 2019-05-02 DIAGNOSIS — L03116 Cellulitis of left lower limb: Secondary | ICD-10-CM

## 2019-05-02 NOTE — Progress Notes (Signed)
Virtual Visit via Video Note  I connected with Aaron Alvarado on 05/02/19 at  1:30 PM EDT by a video enabled telemedicine application and verified that I am speaking with the correct person using two identifiers.  Location: Patient: Located at home during today's encounter  Provider: Located at primary care office   I discussed the limitations of evaluation and management by telemedicine and the availability of in person appointments. The patient expressed understanding and agreed to proceed.  History of Present Illness: Aaron Alvarado presented to urgent care on 04/24/2019 with a complaint of what appeared to be an insect bite on his lower left leg.  He was treated with mupirocin ointment and advised to follow-up if wound worsen.  On 04/26/2019 he represented to the urgent care with wound suspicious for abscess and cellulitis.  He was placed on antibiotics and advised to follow-up with PCP.  He denies any additional redness surrounding the wound or any increased warmth.  He missed a few doses of his antibiotics and is currently attempting to catch up on those doses.  He has been free of fever, nausea, vomiting, chills. He denies pain at the site of the wound. The wound is open however, he is changing the bandages daily.   Observations/Objective:        Assessment and Plan: 1. Wound cellulitis Wound appears to be healing. There is no evidence of erythema surrounding the wound and patient is still completing course of antibiotics. He is advised to complete all antibiotics and do not skip any doses.  Continue mupirocin ointment.  Keep wound bandaged when out in the community to prevent recurrent infection.  Follow Up Instructions: Return to the office in 1 week for wound check and STD testing.   I discussed the assessment and treatment plan with the patient. The patient was provided an opportunity to ask questions and all were answered. The patient agreed with the plan and demonstrated an understanding  of the instructions.   The patient was advised to call back or seek an in-person evaluation if the symptoms worsen or if the condition fails to improve as anticipated.  I provided 20 minutes of non-face-to-face time during this encounter.   [name], FNP

## 2019-05-09 ENCOUNTER — Telehealth: Payer: Self-pay

## 2019-05-09 NOTE — Telephone Encounter (Signed)

## 2019-05-10 ENCOUNTER — Encounter: Payer: Self-pay | Admitting: Family Medicine

## 2019-05-10 ENCOUNTER — Other Ambulatory Visit (HOSPITAL_COMMUNITY)
Admission: RE | Admit: 2019-05-10 | Discharge: 2019-05-10 | Disposition: A | Discharge status: Home / Self Care | Payer: Medicaid Other | Source: Ambulatory Visit | Attending: Family Medicine | Admitting: Family Medicine

## 2019-05-10 ENCOUNTER — Ambulatory Visit (INDEPENDENT_AMBULATORY_CARE_PROVIDER_SITE_OTHER): Payer: Self-pay | Admitting: Family Medicine

## 2019-05-10 ENCOUNTER — Other Ambulatory Visit: Payer: Self-pay

## 2019-05-10 VITALS — BP 141/83 | HR 78 | Temp 97.8°F | Resp 17 | Ht 71.0 in | Wt 266.8 lb

## 2019-05-10 DIAGNOSIS — Z113 Encounter for screening for infections with a predominantly sexual mode of transmission: Secondary | ICD-10-CM | POA: Diagnosis present

## 2019-05-10 DIAGNOSIS — L03116 Cellulitis of left lower limb: Secondary | ICD-10-CM

## 2019-05-10 DIAGNOSIS — L0390 Cellulitis, unspecified: Secondary | ICD-10-CM

## 2019-05-10 NOTE — Progress Notes (Signed)
Patient ID: Aaron Alvarado, male    DOB: 1996/08/08, 23 y.o.   MRN: [id number]  PCP: Bing NeighborsHarris, Tyarra Nolton S, FNP  Chief Complaint  Patient presents with  . STD Screening  . Wound Check    Subjective:  HPI Aaron Alvarado is a 23 y.o. male presents for evaluation presents for wound evaluation and STD screening. Patient was recently treated for wound that resulted in a cellulitis infection. The wound open spontaneously and previously had drained a purulent type drainage. Patient has completed all antibiotics. The wound is approximated and is closing. He denies any pain or tenderness at site. He mostly keeps wound open to air  it open to air when at home and only covers wound when he is leaving the house.  He is requesting routine STD screenings which he obtains every 3 months. He engages in high-risk sexual activity. Current prescribed PrEP therapy with Truvida. He has had no known STD exposures. Social History   Socioeconomic History  . Marital status: Single    Spouse name: Not on file  . Number of children: Not on file  . Years of education: Not on file  . Highest education level: Not on file  Occupational History  . Not on file  Social Needs  . Financial resource strain: Not on file  . Food insecurity    Worry: Not on file    Inability: Not on file  . Transportation needs    Medical: Not on file    Non-medical: Not on file  Tobacco Use  . Smoking status: Never Smoker  . Smokeless tobacco: Never Used  Substance and Sexual Activity  . Alcohol use: No  . Drug use: No    Comment: no marajuana in 6 weeks - 3.5.19  . Sexual activity: Yes    Partners: Female, Male    Birth control/protection: Condom    Comment: PrEP  Lifestyle  . Physical activity    Days per week: Not on file    Minutes per session: Not on file  . Stress: Not on file  Relationships  . Social Musicianconnections    Talks on phone: Not on file    Gets together: Not on file    Attends religious service: Not on file     Active member of club or organization: Not on file    Attends meetings of clubs or organizations: Not on file    Relationship status: Not on file  . Intimate partner violence    Fear of current or ex partner: Not on file    Emotionally abused: Not on file    Physically abused: Not on file    Forced sexual activity: Not on file  Other Topics Concern  . Not on file  Social History Narrative  . Not on file    Family History  Problem Relation Age of Onset  . Hypertension Mother   . Fibromyalgia Mother    Review of Systems Pertinent negatives listed in HPI Patient Active Problem List   Diagnosis Date Noted  . Therapeutic drug monitoring 01/17/2018  . High risk sexual behavior 10/26/2017  . 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 H/O: HTN (hypertension) 10/26/2011  . History of acanthosis skin now resolved 2015 10/26/2011  . Historyof GERD 05/06/2011  . Obesity, unspecified 04/10/2010  . Allergic rhinitis 01/12/2007    Allergies  Allergen Reactions  . Penicillins Hives and Swelling    Prior to Admission medications   Medication Sig Start Date End Date Taking? Authorizing Provider  cetirizine (ZYRTEC) 10 MG tablet TAKE 1 TABLET BY MOUTH EVERY DAY 10/18/18  Yes Dickie La, MD  emtricitabine-tenofovir (TRUVADA) 200-300 MG tablet Take 1 tablet by mouth daily. 09/13/18  Yes Dickie La, MD  fluticasone [facility]) 50 MCG/ACT nasal spray SPRAY 2 SPRAYS INTO EACH NOSTRIL EVERY DAY 10/18/18  Yes Dickie La, MD  mupirocin ointment (BACTROBAN) 2 % Apply 1 application topically 2 (two) times daily. 04/24/19  Yes Ok Edwards, PA-C  PROAIR HFA 108 [id number] Base) MCG/ACT inhaler INHALE 2-4 PUFFS INTO THE LUNGS AS NEEDED FOR WHEEZING 02/13/18  Yes Dickie La, MD    Past Medical, Surgical Family and Social History reviewed and updated.    Objective:   Today's Vitals   05/10/19 1102  BP: (!) 141/83  Pulse: 78  Resp: 17  Temp: 97.8 F (36.6 C)  TempSrc: Temporal  SpO2: 97%  Weight: 266 lb  12.8 oz (121 kg)  Height: 5\' 11"  (1.803 m)    Wt Readings from Last 3 Encounters:  05/10/19 266 lb 12.8 oz (121 kg)  01/25/19 265 lb 12.8 oz (120.6 kg)  09/13/18 279 lb 12.8 oz (126.9 kg)     Physical Exam General appearance: alert, well developed, well nourished, cooperative and in no distress Head: Normocephalic, without obvious abnormality, atraumatic Respiratory: Respirations even and unlabored, normal respiratory rate Heart: rate and rhythm normal. No gallop or murmurs noted on exam  Extremities: No gross deformities Skin: Skin color appropriate, left leg wound -well approximate evidence of wound healing  Psych: Appropriate mood and affect. Neurologic: Mental status: Alert, oriented to person, place, and time, thought content appropriate.  No results found for: POCGLU  Lab Results  Component Value Date   HGBA1C 5.5 01/25/2019     Assessment & Plan:  1. Wound cellulitis -Infections appears to have resolved.  Patient encouraged to continue Bactroban until wound completely closes.  Red flags discussed wanting additional follow-up.  2. Screen for STD (sexually transmitted disease) -Continue PrEP  therapy -Encouraged to use barrier protection with sexual encounters to reduce the risk of acquiring or spreading STDs. - HSV(herpes simplex vrs) 1+2 ab-IgG - Urine cytology ancillary only - RPR - HIV antibody (with reflex) - Hepatitis c vrs RNA detect by PCR-qual - Hep B Core Ab W/Reflex - Hepatitis B surface antigen  RTC: 3 months STD screen   -The patient was given clear instructions to go to ER or return to medical center if symptoms do not improve, worsen or new problems develop. The patient verbalized understanding.    [name], FNP Primary Care at [facility] [address] [place] [phone]: [phone]

## 2019-05-10 NOTE — Addendum Note (Signed)
Addended by: Carylon Perches on: 05/10/2019 11:09 AM   Modules accepted: Orders

## 2019-05-11 LAB — URINE CYTOLOGY ANCILLARY ONLY
Chlamydia: NEGATIVE
Neisseria Gonorrhea: NEGATIVE
Trichomonas: NEGATIVE

## 2019-05-13 LAB — HSV(HERPES SIMPLEX VRS) I + II AB-IGG
HSV 1 Glycoprotein G Ab, IgG: 1.4 index — ABNORMAL HIGH (ref 0.00–0.90)
HSV 2 IgG, Type Spec: 1.41 index — ABNORMAL HIGH (ref 0.00–0.90)

## 2019-05-13 LAB — HEPATITIS B CORE AB W/REFLEX: Hep B Core Total Ab: NEGATIVE

## 2019-05-13 LAB — HEPATITIS B SURFACE ANTIGEN: Hepatitis B Surface Ag: NEGATIVE

## 2019-05-13 LAB — HIV ANTIBODY (ROUTINE TESTING W REFLEX): HIV Screen 4th Generation wRfx: NONREACTIVE

## 2019-05-13 LAB — RPR: RPR Ser Ql: NONREACTIVE

## 2019-05-13 LAB — HSV-2 IGG SUPPLEMENTAL TEST: HSV-2 IgG Supplemental Test: NEGATIVE

## 2019-05-13 LAB — HEPATITIS C VRS RNA DETECT BY PCR-QUAL: HCV RNA NAA Qualitative: NEGATIVE

## 2019-06-01 ENCOUNTER — Telehealth (INDEPENDENT_AMBULATORY_CARE_PROVIDER_SITE_OTHER): Payer: Medicaid Other | Admitting: Family Medicine

## 2019-06-01 DIAGNOSIS — J029 Acute pharyngitis, unspecified: Secondary | ICD-10-CM

## 2019-06-01 DIAGNOSIS — I10 Essential (primary) hypertension: Secondary | ICD-10-CM

## 2019-06-01 DIAGNOSIS — Z20822 Contact with and (suspected) exposure to covid-19: Secondary | ICD-10-CM

## 2019-06-01 MED ORDER — LOSARTAN POTASSIUM 25 MG PO TABS: 25.0000 mg | ORAL_TABLET | Freq: Every day | ORAL | 6 refills | Status: DC

## 2019-06-01 NOTE — Progress Notes (Signed)
Virtual Visit via Video Note  I connected with Aaron Alvarado on 06/01/19 at 10:50 AM EDT by a video enabled telemedicine application and verified that I am speaking with the correct person using two identifiers.  Location: Patient: Located at home during today's encounter  Provider: Located at primary care office   I discussed the limitations of evaluation and management by telemedicine and the availability of in person appointments. The patient expressed understanding and agreed to proceed.  History of Present Illness: Aaron Alvarado is present during today's telemedicine encounter to initiate hypertension therapy. 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 His blood pressure reading back then were presently in 140's/<90. He was previously managed with hydrochlorothiazide which was discontinued, per office note 07/18/2014 by patient.  He has not been treated again for hypertension although on all of his recent visits here in office his blood pressure has been elevated.  He was previously followed by family medicine and reviewed those charts as well patient has had elevations in blood pressure.  He is in agreement to start blood pressure medicine today.  He denies any headaches, shortness of breath, wheezing, unintentional weight gain, or chest tightness.  COVID symptoms Patient was referred for COVID testing yesterday as he has recently traveled to [facility] and endorses a persistent sore throat.  Patient reports that he did have time yesterday to obtain COVID test and plans to go to a drive-through site on Monday.  No sick contacts in his family who attended the [facility] trip with him.  He is afebrile and denies any GI symptoms. Assessment and Plan: 1. Suspected Covid-19 Virus Infection -First again the importance of getting tested for COVID as he recently traveled to very high risk COVID-19 area.  Although his symptoms may be related to  allergies that he may not and he could be spreading COVID-19 to the community.  Patient verbalized understanding and stated he will get tested.  2. Essential hypertension -We will initiate antihypertensive therapy with losartan 25 mg daily.  Patient advised that medication may need to be titrated to achieve desired blood pressure goal which is less than 130/90.  Also educated patient on the importance of losing weight his current 37.21 which increases his risk for early onset heart disease.  Patient verbalized understanding and agreed to begin to engage in some formal physical activity.  Follow Up Instructions: Return for follow-up in 6 weeks for hypertension management.   I discussed the assessment and treatment plan with the patient. The patient was provided an opportunity to ask questions and all were answered. The patient agreed with the plan and demonstrated an understanding of the instructions.   The patient was advised to call back or seek an in-person evaluation if the symptoms worsen or if the condition fails to improve as anticipated.  I provided 25 minutes of non-face-to-face time during this encounter.   [name], FNP

## 2019-06-05 ENCOUNTER — Other Ambulatory Visit: Payer: Self-pay

## 2019-06-05 DIAGNOSIS — Z20828 Contact with and (suspected) exposure to other viral communicable diseases: Secondary | ICD-10-CM

## 2019-06-05 DIAGNOSIS — Z20822 Contact with and (suspected) exposure to covid-19: Secondary | ICD-10-CM

## 2019-06-07 LAB — NOVEL CORONAVIRUS, NAA: SARS-CoV-2, NAA: NOT DETECTED

## 2019-08-01 ENCOUNTER — Telehealth: Payer: Self-pay

## 2019-08-01 NOTE — Telephone Encounter (Signed)
Called patient to do their pre-visit COVID screening.  Patient states provider said to cancel this appointment & keep appointment on 08/16/2019.

## 2019-08-02 ENCOUNTER — Ambulatory Visit: Payer: Medicaid Other

## 2019-08-15 ENCOUNTER — Telehealth: Payer: Self-pay

## 2019-08-15 NOTE — Telephone Encounter (Signed)

## 2019-08-16 ENCOUNTER — Ambulatory Visit (INDEPENDENT_AMBULATORY_CARE_PROVIDER_SITE_OTHER): Payer: Self-pay | Admitting: Critical Care Medicine

## 2019-08-16 ENCOUNTER — Other Ambulatory Visit: Payer: Self-pay

## 2019-08-16 VITALS — BP 120/79 | HR 55 | Temp 97.3°F | Resp 17 | Ht 71.0 in | Wt 267.6 lb

## 2019-08-16 DIAGNOSIS — E669 Obesity, unspecified: Secondary | ICD-10-CM

## 2019-08-16 DIAGNOSIS — J302 Other seasonal allergic rhinitis: Secondary | ICD-10-CM

## 2019-08-16 DIAGNOSIS — J309 Allergic rhinitis, unspecified: Secondary | ICD-10-CM

## 2019-08-16 DIAGNOSIS — Z6837 Body mass index (BMI) 37.0-37.9, adult: Secondary | ICD-10-CM

## 2019-08-16 DIAGNOSIS — F411 Generalized anxiety disorder: Secondary | ICD-10-CM | POA: Insufficient documentation

## 2019-08-16 DIAGNOSIS — K219 Gastro-esophageal reflux disease without esophagitis: Secondary | ICD-10-CM

## 2019-08-16 DIAGNOSIS — Z7253 High risk bisexual behavior: Secondary | ICD-10-CM

## 2019-08-16 DIAGNOSIS — Z23 Encounter for immunization: Secondary | ICD-10-CM

## 2019-08-16 DIAGNOSIS — I10 Essential (primary) hypertension: Secondary | ICD-10-CM

## 2019-08-16 MED ORDER — FLUTICASONE PROPIONATE 50 MCG/ACT NA SUSP: NASAL | 8 refills | Status: DC

## 2019-08-16 MED ORDER — TRUVADA 200-300 MG PO TABS: 1.0000 | ORAL_TABLET | Freq: Every day | ORAL | 4 refills | Status: DC

## 2019-08-16 MED ORDER — OMEPRAZOLE 20 MG PO CPDR: 20.0000 mg | DELAYED_RELEASE_CAPSULE | Freq: Every day | ORAL | 4 refills | Status: DC

## 2019-08-16 MED ORDER — CETIRIZINE HCL 10 MG PO TABS: 10.0000 mg | ORAL_TABLET | Freq: Every day | ORAL | 8 refills | Status: DC

## 2019-08-16 NOTE — Progress Notes (Signed)
Will get flu shot today.  Doesn't check BP at home. Denies chest pain, SHOB, headaches, palpitations, dizziness.

## 2019-08-16 NOTE — Patient Instructions (Addendum)
You have refills on losartan stay on that 25 mg daily the refills are at the [facility]  I sent refills on Flonase and cetirizine to [facility]  I sent a refill on Truvada [facility] and to see if you would qualify for patient assistance they will reach out to you  I sent a prescription for omeprazole 20 mg take 1/2-hour before breakfast then eat and I sent this to the [facility]  Flu shot was given  Appointment with clinical social worker Christa See will be made for your anxiety  Follow a reflux diet as outlined below  Return in 3 months for primary care follow-up   Food Choices for Gastroesophageal Reflux Disease, Adult When you have gastroesophageal reflux disease (GERD), the foods you eat and your eating habits are very important. Choosing the right foods can help ease your discomfort. Think about working with a nutrition specialist (dietitian) to help you make good choices. What are tips for following this plan?  Meals  Choose healthy foods that are low in fat, such as fruits, vegetables, whole grains, low-fat dairy products, and lean meat, fish, and poultry.  Eat small meals often instead of 3 large meals a day. Eat your meals slowly, and in a place where you are relaxed. Avoid bending over or lying down until 2-3 hours after eating.  Avoid eating meals 2-3 hours before bed.  Avoid drinking a lot of liquid with meals.  Cook foods using methods other than frying. Bake, grill, or broil food instead.  Avoid or limit: ? Chocolate. ? Peppermint or spearmint. ? Alcohol. ? Pepper. ? Black and decaffeinated coffee. ? Black and decaffeinated tea. ? Bubbly (carbonated) soft drinks. ? Caffeinated energy drinks and soft drinks.  Limit high-fat foods such as: ? Fatty meat or fried foods. ? Whole milk, cream, butter, or ice cream. ? Nuts and nut butters. ? Pastries, donuts, and sweets made with butter or  shortening.  Avoid foods that cause symptoms. These foods may be different for everyone. Common foods that cause symptoms include: ? Tomatoes. ? Oranges, lemons, and limes. ? Peppers. ? Spicy food. ? Onions and garlic. ? Vinegar. Lifestyle  Maintain a healthy weight. Ask your doctor what weight is healthy for you. If you need to lose weight, work with your doctor to do so safely.  Exercise for at least 30 minutes for 5 or more days each week, or as told by your doctor.  Wear loose-fitting clothes.  Do not smoke. If you need help quitting, ask your doctor.  Sleep with the head of your bed higher than your feet. Use a wedge under the mattress or blocks under the bed frame to raise the head of the bed. Summary  When you have gastroesophageal reflux disease (GERD), food and lifestyle choices are very important in easing your symptoms.  Eat small meals often instead of 3 large meals a day. Eat your meals slowly, and in a place where you are relaxed.  Limit high-fat foods such as fatty meat or fried foods.  Avoid bending over or lying down until 2-3 hours after eating.  Avoid peppermint and spearmint, caffeine, alcohol, and chocolate. This information is not intended to replace advice given to you by your health care provider. Make sure you discuss any questions you have with your health care provider. Document Released: 05/02/2012 Document Revised: 02/22/2019 Document Reviewed: 12/07/2016 Elsevier Patient Education  2020 [employer].

## 2019-08-16 NOTE — Assessment & Plan Note (Signed)
Obesity with hypertension as a comorbidity  His weight is down somewhat from the past however he needs to continue to monitor his diet the good news he is increasing his activity with multiple different types of dance classes

## 2019-08-16 NOTE — Assessment & Plan Note (Signed)
Allergic rhinitis controlled at this time  Patient will have Zyrtec and Flonase refilled

## 2019-08-16 NOTE — Assessment & Plan Note (Signed)
The patient scores very high on anxiety scale  I am going to connect this patient with our licensed clinical social worker for counseling and hold off on medications at this time

## 2019-08-16 NOTE — Assessment & Plan Note (Signed)
Ongoing hypertension that is markedly improved since on losartan  I have refilled the losartan he will maintain 25 mg daily

## 2019-08-16 NOTE — Progress Notes (Signed)
Subjective:    Patient ID: Aaron Alvarado, male    DOB: 15-Dec-1995, 23 y.o.   MRN: [id number]  This is a 23 year old male here back in follow-up for primary care.  His last visit was in July and was a telemedicine visit.  He has a history of hypertension, high risk sexual activity, reflux disease, allergic rhinitis, posttraumatic stress disorder in the past, anxiety  The patient had been on Truvada for HIV prophylaxis in the past but has been off this since July.  He also has been on losartan 25 mg daily which was started over the telemedicine visit in July blood pressure now today is quite good at 120/79 previous numbers have been much higher.  The patient did have a negative COVID test at the last visit.  Today the patient denies any significant complaints except for significant reflux symptoms.  He has had reflux in the past but is not currently on medications.  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   Past Medical History:  Diagnosis Date  . Asthma   . Blount's disease   . Environmental allergies   . Hypertension   . PTSD (post-traumatic stress disorder)      Family History  Problem Relation Age of Onset  . Hypertension Mother   . Fibromyalgia Mother      Social History   Socioeconomic History  . Marital status: Single    Spouse name: Not on file  . Number of children: Not on file  . Years of education: Not on file  . Highest education level: Not on file  Occupational History  . Not on file  Social Needs  . Financial resource strain: Not on file  . Food insecurity    Worry: Not on file    Inability: Not on file  . Transportation needs    Medical: Not on file    Non-medical: Not on file  Tobacco Use  . Smoking status: Never Smoker  . Smokeless tobacco: Never Used  Substance and Sexual Activity  . Alcohol use: No  . Drug use: No    Comment: no marajuana in 6 weeks - 3.5.19  . Sexual activity: Yes    Partners: Female, Male    Birth  control/protection: Condom    Comment: PrEP  Lifestyle  . Physical activity    Days per week: Not on file    Minutes per session: Not on file  . Stress: Not on file  Relationships  . Social Musicianconnections    Talks on phone: Not on file    Gets together: Not on file    Attends religious service: Not on file    Active member of club or organization: Not on file    Attends meetings of clubs or organizations: Not on file    Relationship status: Not on file  . Intimate partner violence    Fear of current or ex partner: Not on file    Emotionally abused: Not on file    Physically abused: Not on file    Forced sexual activity: Not on file  Other Topics Concern  . Not on file  Social History Narrative  . Not on file     Allergies  Allergen Reactions  . Penicillins Hives and Swelling     Outpatient Medications Prior to Visit  Medication Sig Dispense Refill  . losartan (COZAAR) 25 MG tablet Take 1 tablet (25 mg total) by mouth daily. 30 tablet 6  . PROAIR HFA  108 (90 Base) MCG/ACT inhaler INHALE 2-4 PUFFS INTO THE LUNGS AS NEEDED FOR WHEEZING 8.5 Inhaler 1  . cetirizine (ZYRTEC) 10 MG tablet TAKE 1 TABLET BY MOUTH EVERY DAY 30 tablet 8  . fluticasone (FLONASE) 50 MCG/ACT nasal spray SPRAY 2 SPRAYS INTO EACH NOSTRIL EVERY DAY 16 g 8  . emtricitabine-tenofovir (TRUVADA) 200-300 MG tablet Take 1 tablet by mouth daily. (Patient not taking: Reported on 08/16/2019) 90 tablet 4   No facility-administered medications prior to visit.     Review of Systems Constitutional:   No  weight loss, night sweats,  Fevers, chills, fatigue, lassitude. HEENT:   No headaches,  Difficulty swallowing,  Tooth/dental problems,  Sore throat,                No sneezing, itching, ear ache, nasal congestion, post nasal drip,   CV:  No chest pain,  Orthopnea, PND, swelling in lower extremities, anasarca, dizziness, palpitations  GI   heartburn, indigestion, abdominal pain, nausea, vomiting, diarrhea, change in  bowel habits, loss of appetite  Resp: No shortness of breath with exertion or at rest.  No excess mucus, no productive cough,  No non-productive cough,  No coughing up of blood.  No change in color of mucus.  No wheezing.  No chest wall deformity  Skin: no rash or lesions.  GU: no dysuria, change in color of urine, no urgency or frequency.  No flank pain.  MS:  No joint pain or swelling.  No decreased range of motion.  No back pain.  Psych:  No change in mood or affect. No depression or anxiety.  No memory loss.     Objective:   Physical Exam Vitals:   08/16/19 1331  BP: 120/79  Pulse: (!) 55  Resp: 17  Temp: (!) 97.3 F (36.3 C)  TempSrc: Temporal  SpO2: 98%  Weight: 267 lb 9.6 oz (121.4 kg)  Height: 5\' 11"  (1.803 m)    Gen: Pleasant, obese  BMI 37, in no distress,  normal affect  ENT: No lesions,  mouth clear,  oropharynx clear, no postnasal drip  Neck: No JVD, no TMG, no carotid bruits  Lungs: No use of accessory muscles, no dullness to percussion, clear without rales or rhonchi  Cardiovascular: RRR, heart sounds normal, no murmur or gallops, no peripheral edema  Abdomen: soft and NT, no HSM,  BS normal  Musculoskeletal: No deformities, no cyanosis or clubbing  Neuro: alert, non focal  Skin: Warm, no lesions or rashes  HIV neg.  HSV labs in 04/2019 only + for past exposure.       Assessment & Plan:  I personally reviewed all images and lab data in the [facility] system as well as any outside material available during this office visit and agree with the  radiology impressions.   HTN (hypertension) Ongoing hypertension that is markedly improved since on losartan  I have refilled the losartan he will maintain 25 mg daily  Allergic rhinitis Allergic rhinitis controlled at this time  Patient will have Zyrtec and Flonase refilled  GERD (gastroesophageal reflux disease) Active reflux symptoms at this time  I gave the patient a reflux diet and will prescribe  omeprazole 20 mg daily  Generalized anxiety disorder The patient scores very high on anxiety scale  I am going to connect this patient with our licensed clinical social worker for counseling and hold off on medications at this time  High risk sexual behavior History of high risk sexual activity in the past  The patient had been on Truvada for HIV prophylaxis in the past I will represcribed this today however the patient may need patient assistance due to the expense of this medication we will [facility] and see if he would qualify for him for any patient assistance  Obesity (BMI 35.0-39.9 without comorbidity) Obesity with hypertension as a comorbidity  His weight is down somewhat from the past however he needs to continue to monitor his diet the good news he is increasing his activity with multiple different types of dance classes   Ethanael was seen today for hypertension.  Diagnoses and all orders for this visit:  Essential hypertension  Needs flu shot -     Flu Vaccine QUAD 6+ mos PF IM (Fluarix Quad PF)  High risk bisexual behavior -     emtricitabine-tenofovir (TRUVADA) 200-300 MG tablet; Take 1 tablet by mouth daily.  Other seasonal allergic rhinitis -     fluticasone (FLONASE) 50 MCG/ACT nasal spray; SPRAY 2 SPRAYS INTO EACH NOSTRIL EVERY DAY  Allergic rhinitis, unspecified seasonality, unspecified trigger  Gastroesophageal reflux disease without esophagitis  Generalized anxiety disorder  Obesity (BMI 35.0-39.9 without comorbidity)  Other orders -     cetirizine (ZYRTEC) 10 MG tablet; Take 1 tablet (10 mg total) by mouth daily. -     omeprazole (PRILOSEC) 20 MG capsule; Take 1 capsule (20 mg total) by mouth daily.   Flu vaccine was given at this visit

## 2019-08-16 NOTE — Assessment & Plan Note (Signed)
History of high risk sexual activity in the past  The patient had been on Truvada for HIV prophylaxis in the past I will represcribed this today however the patient may need patient assistance due to the expense of this medication we will [facility] this [facility] and see if he would qualify for him for any patient assistance

## 2019-08-16 NOTE — Assessment & Plan Note (Signed)
Active reflux symptoms at this time  I gave the patient a reflux diet and will prescribe omeprazole 20 mg daily

## 2019-08-24 ENCOUNTER — Emergency Department (HOSPITAL_BASED_OUTPATIENT_CLINIC_OR_DEPARTMENT_OTHER)
Admission: EM | Admit: 2019-08-24 | Discharge: 2019-08-24 | Disposition: A | Discharge status: Home / Self Care | Payer: Medicaid Other | Attending: Emergency Medicine | Admitting: Emergency Medicine

## 2019-08-24 ENCOUNTER — Other Ambulatory Visit: Payer: Self-pay

## 2019-08-24 ENCOUNTER — Encounter (HOSPITAL_BASED_OUTPATIENT_CLINIC_OR_DEPARTMENT_OTHER): Payer: Self-pay

## 2019-08-24 DIAGNOSIS — I10 Essential (primary) hypertension: Secondary | ICD-10-CM | POA: Insufficient documentation

## 2019-08-24 DIAGNOSIS — Z79899 Other long term (current) drug therapy: Secondary | ICD-10-CM | POA: Insufficient documentation

## 2019-08-24 DIAGNOSIS — J029 Acute pharyngitis, unspecified: Secondary | ICD-10-CM | POA: Insufficient documentation

## 2019-08-24 DIAGNOSIS — Z20828 Contact with and (suspected) exposure to other viral communicable diseases: Secondary | ICD-10-CM | POA: Insufficient documentation

## 2019-08-24 DIAGNOSIS — Z880 Allergy status to penicillin: Secondary | ICD-10-CM | POA: Insufficient documentation

## 2019-08-24 DIAGNOSIS — J45909 Unspecified asthma, uncomplicated: Secondary | ICD-10-CM | POA: Insufficient documentation

## 2019-08-24 LAB — GROUP A STREP BY PCR: Group A Strep by PCR: NOT DETECTED

## 2019-08-24 MED ORDER — FLUTICASONE PROPIONATE 50 MCG/ACT NA SUSP: 1.0000 | Freq: Every day | NASAL | 0 refills | Status: DC

## 2019-08-24 NOTE — Discharge Instructions (Signed)
You were seen in the emergency department today for a sore throat.  Your strep test was negative.  We suspect your symptoms are related to a virus or allergies.  We are sending you with Flonase to use 1 spray in each nostril daily to help with congestion.  Please take Tylenol/Motrin per over-the-counter dosing to help with discomfort.  Follow attached diet guidelines.with diarrhea.  We have prescribed you new medication(s) today. Discuss the medications prescribed today with your pharmacist as they can have adverse effects and interactions with your other medicines including over the counter and prescribed medications. Seek medical evaluation if you start to experience new or abnormal symptoms after taking one of these medicines, seek care immediately if you start to experience difficulty breathing, feeling of your throat closing, facial swelling, or rash as these could be indications of a more serious allergic reaction  We have tested you for coronavirus, we will call you if results are positive.   We are instructing patient's with COVID to quarantine themselves for 14 days. You may be able to discontinue self quarantine if the following conditions are met:   Persons with COVID-19 who have symptoms and were directed to care for themselves at home may discontinue home isolation under the  following conditions: - It has been at least 7 days have passed since symptoms first appeared. - AND at least 3 days (72 hours) have passed since recovery defined as resolution of fever without the use of fever-reducing medications and improvement in respiratory symptoms (e.g., cough, shortness of breath)  Please follow the below quarantine instructions.   Please follow up with primary care within 3-5 days for re-evaluation- call prior to going to the office to make them aware of your symptoms. Return to the ER for new or worsening symptoms including but not limited to increased work of breathing, fever, chest pain,  passing out, or any other concerns.       Person Under Monitoring Name: Aaron Alvarado  Location: [place] [address]   Infection Prevention Recommendations for Individuals Confirmed to have, or Being Evaluated for, 2019 Novel Coronavirus (COVID-19) Infection Who Receive Care at Home  Individuals who are confirmed to have, or are being evaluated for, COVID-19 should follow the prevention steps below until a healthcare provider or local or state health department says they can return to normal activities.  Stay home except to get medical care You should restrict activities outside your home, except for getting medical care. Do not go to work, school, or public areas, and do not use public transportation or taxis.  Call ahead before visiting your doctor Before your medical appointment, call the healthcare provider and tell them that you have, or are being evaluated for, COVID-19 infection. This will help the healthcare providers office take steps to keep other people from getting infected. Ask your healthcare provider to call the local or state health department.  Monitor your symptoms Seek prompt medical attention if your illness is worsening (e.g., difficulty breathing). Before going to your medical appointment, call the healthcare provider and tell them that you have, or are being evaluated for, COVID-19 infection. Ask your healthcare provider to call the local or state health department.  Wear a facemask You should wear a facemask that covers your nose and mouth when you are in the same room with other people and when you visit a healthcare provider. People who live with or visit you should also wear a facemask while they are in  the same room with you.  Separate yourself from other people in your home As much as possible, you should stay in a different room from other people in your home. Also, you should use a separate bathroom, if available.  Avoid  sharing household items You should not share dishes, drinking glasses, cups, eating utensils, towels, bedding, or other items with other people in your home. After using these items, you should wash them thoroughly with soap and water.  Cover your coughs and sneezes Cover your mouth and nose with a tissue when you cough or sneeze, or you can cough or sneeze into your sleeve. Throw used tissues in a lined trash can, and immediately wash your hands with soap and water for at least 20 seconds or use an alcohol-based hand rub.  Wash your [facility] your hands often and thoroughly with soap and water for at least 20 seconds. You can use an alcohol-based hand sanitizer if soap and water are not available and if your hands are not visibly dirty. Avoid touching your eyes, nose, and mouth with unwashed hands.   Prevention Steps for Caregivers and Household Members of Individuals Confirmed to have, or Being Evaluated for, COVID-19 Infection Being Cared for in the Home  If you live with, or provide care at home for, a person confirmed to have, or being evaluated for, COVID-19 infection please follow these guidelines to prevent infection:  Follow healthcare providers instructions Make sure that you understand and can help the patient follow any healthcare provider instructions for all care.  Provide for the patients basic needs You should help the patient with basic needs in the home and provide support for getting groceries, prescriptions, and other personal needs.  Monitor the patients symptoms If they are getting sicker, call his or her medical provider and tell them that the patient has, or is being evaluated for, COVID-19 infection. This will help the healthcare providers office take steps to keep other people from getting infected. Ask the healthcare provider to call the local or state health department.  Limit the number of people who have contact with the patient If possible, have  only one caregiver for the patient. Other household members should stay in another home or place of residence. If this is not possible, they should stay in another room, or be separated from the patient as much as possible. Use a separate bathroom, if available. Restrict visitors who do not have an essential need to be in the home.  Keep older adults, very young children, and other sick people away from the patient Keep older adults, very young children, and those who have compromised immune systems or chronic health conditions away from the patient. This includes people with chronic heart, lung, or kidney conditions, diabetes, and cancer.  Ensure good ventilation Make sure that shared spaces in the home have good air flow, such as from an air conditioner or an opened window, weather permitting.  Wash your hands often Wash your hands often and thoroughly with soap and water for at least 20 seconds. You can use an alcohol based hand sanitizer if soap and water are not available and if your hands are not visibly dirty. Avoid touching your eyes, nose, and mouth with unwashed hands. Use disposable paper towels to dry your hands. If not available, use dedicated cloth towels and replace them when they become wet.  Wear a facemask and gloves Wear a disposable facemask at all times in the room and gloves when you touch  or have contact with the patients blood, body fluids, and/or secretions or excretions, such as sweat, saliva, sputum, nasal mucus, vomit, urine, or feces.  Ensure the mask fits over your nose and mouth tightly, and do not touch it during use. Throw out disposable facemasks and gloves after using them. Do not reuse. Wash your hands immediately after removing your facemask and gloves. If your personal clothing becomes contaminated, carefully remove clothing and launder. Wash your hands after handling contaminated clothing. Place all used disposable facemasks, gloves, and other waste in a  lined container before disposing them with other household waste. Remove gloves and wash your hands immediately after handling these items.  Do not share dishes, glasses, or other household items with the patient Avoid sharing household items. You should not share dishes, drinking glasses, cups, eating utensils, towels, bedding, or other items with a patient who is confirmed to have, or being evaluated for, COVID-19 infection. After the person uses these items, you should wash them thoroughly with soap and water.  Wash laundry thoroughly Immediately remove and wash clothes or bedding that have blood, body fluids, and/or secretions or excretions, such as sweat, saliva, sputum, nasal mucus, vomit, urine, or feces, on them. Wear gloves when handling laundry from the patient. Read and follow directions on labels of laundry or clothing items and detergent. In general, wash and dry with the warmest temperatures recommended on the label.  Clean all areas the individual has used often Clean all touchable surfaces, such as counters, tabletops, doorknobs, bathroom fixtures, toilets, phones, keyboards, tablets, and bedside tables, every day. Also, clean any surfaces that may have blood, body fluids, and/or secretions or excretions on them. Wear gloves when cleaning surfaces the patient has come in contact with. Use a diluted bleach solution (e.g., dilute bleach with 1 part bleach and 10 parts water) or a household disinfectant with a label that says EPA-registered for coronaviruses. To make a bleach solution at home, add 1 tablespoon of bleach to 1 quart (4 cups) of water. For a larger supply, add  cup of bleach to 1 gallon (16 cups) of water. Read labels of cleaning products and follow recommendations provided on product labels. Labels contain instructions for safe and effective use of the cleaning product including precautions you should take when applying the product, such as wearing gloves or eye  protection and making sure you have good ventilation during use of the product. Remove gloves and wash hands immediately after cleaning.  Monitor yourself for signs and symptoms of illness Caregivers and household members are considered close contacts, should monitor their health, and will be asked to limit movement outside of the home to the extent possible. Follow the monitoring steps for close contacts listed on the symptom monitoring form.   ? If you have additional questions, contact your local health department or call the epidemiologist on call at [phone] (available 24/7). ? This guidance is subject to change. For the most up-to-date guidance from [facility], please refer to their website: [email or web address].pl

## 2019-08-24 NOTE — ED Triage Notes (Signed)
Pt c/o sore throat, earache, diarrhea x 2 days-pt with possible covid exposure-NAD-steady gait

## 2019-08-24 NOTE — ED Provider Notes (Signed)
[facility] EMERGENCY DEPARTMENT Provider Note   CSN: [id number] Arrival date & time: 08/24/19  1433     History   Chief Complaint Chief Complaint  Patient presents with  . Sore Throat    HPI Aaron Alvarado is a 23 y.o. male with a hx of HTN, PTSD, and Blounts disease who presents to the emergency department with complaints of sore throat that began 2 days ago.  Patient states he has a constant sore throat that is associated with ear pain and has had 2 loose bowel movements.  No alleviating or aggravating factors to his symptoms.  No measured fevers at home, he does not have a thermometer, but states he has felt hot/cold.  Denies nausea, vomiting, melena, hematochezia, abdominal pain, body aches, loss of taste/smell, chest pain, shortness of breath, or coughing.  He states that a client was sick the other day but he is not sure what he was sick with.  There is also a potential covid exposure at work but no formal diagnosis has been made.    HPI  Past Medical History:  Diagnosis Date  . Asthma   . Blount's disease   . Environmental allergies   . Hypertension   . PTSD (post-traumatic stress disorder)     Patient Active Problem List   Diagnosis Date Noted  . Generalized anxiety disorder 08/16/2019  . Therapeutic drug monitoring 01/17/2018  . High risk sexual behavior 10/26/2017  . 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 HTN (hypertension) 10/26/2011  . History of acanthosis skin now resolved 2015 10/26/2011  . GERD (gastroesophageal reflux disease) 05/06/2011  . Obesity (BMI 35.0-39.9 without comorbidity) 04/10/2010  . Allergic rhinitis 01/12/2007    Past Surgical History:  Procedure Laterality Date  . KNEE ARTHROSCOPY  2011        Home Medications    Prior to Admission medications   Medication Sig Start Date End Date Taking? Authorizing Provider  cetirizine (ZYRTEC) 10 MG tablet Take 1 tablet (10 mg total) by mouth daily. 08/16/19   Storm Frisk, MD   emtricitabine-tenofovir (TRUVADA) 200-300 MG tablet Take 1 tablet by mouth daily. 08/16/19   Storm Frisk, MD  fluticasone [facility]) 50 MCG/ACT nasal spray SPRAY 2 SPRAYS INTO EACH NOSTRIL EVERY DAY 08/16/19   Storm Frisk, MD  losartan (COZAAR) 25 MG tablet Take 1 tablet (25 mg total) by mouth daily. 06/01/19   Bing Neighbors, FNP  omeprazole (PRILOSEC) 20 MG capsule Take 1 capsule (20 mg total) by mouth daily. 08/16/19   Storm Frisk, MD  PROAIR HFA 108 [phone] Base) MCG/ACT inhaler INHALE 2-4 PUFFS INTO THE LUNGS AS NEEDED FOR WHEEZING 02/13/18   Nestor Ramp, MD    Family History Family History  Problem Relation Age of Onset  . Hypertension Mother   . Fibromyalgia Mother     Social History Social History   Tobacco Use  . Smoking status: Never Smoker  . Smokeless tobacco: Never Used  Substance Use Topics  . Alcohol use: Yes    Comment: weekly  . Drug use: Not Currently     Allergies   Penicillins   Review of Systems Review of Systems  Constitutional: Positive for chills (hot/cold). Negative for fever.  HENT: Positive for ear pain and sore throat. Negative for congestion, drooling, trouble swallowing and voice change.   Respiratory: Negative for cough and shortness of breath.   Cardiovascular: Negative for chest pain.  Gastrointestinal: Positive for diarrhea. Negative for abdominal pain, blood  in stool, nausea and vomiting.  Neurological: Negative for syncope.     Physical Exam Updated Vital Signs BP (!) 155/109 (BP Location: Left Arm)   Pulse 75   Temp 98.2 F (36.8 C) (Oral)   Resp 18   Ht 6' (1.829 m)   Wt 122.5 kg   SpO2 100%   BMI 36.62 kg/m   Physical Exam Vitals signs and nursing note reviewed.  Constitutional:      General: He is not in acute distress.    Appearance: He is well-developed. He is not toxic-appearing.  HENT:     Head: Normocephalic and atraumatic.     Right Ear: Ear canal normal. Tympanic membrane is not perforated,  erythematous, retracted or bulging.     Left Ear: Ear canal normal. Tympanic membrane is not perforated, erythematous, retracted or bulging.     Ears:     Comments: No mastoid erythema/tenderness/swelling.    Nose: Congestion present.     Right Sinus: No maxillary sinus tenderness or frontal sinus tenderness.     Left Sinus: No maxillary sinus tenderness or frontal sinus tenderness.     Mouth/Throat:     Pharynx: Uvula midline. Posterior oropharyngeal erythema present. No oropharyngeal exudate.     Comments: Posterior oropharynx is symmetric appearing. Patient tolerating own secretions without difficulty. No trismus. No drooling. No hot potato voice. No swelling beneath the tongue, submandibular compartment is soft.  Eyes:     General:        Right eye: No discharge.        Left eye: No discharge.     Conjunctiva/sclera: Conjunctivae normal.     Pupils: Pupils are equal, round, and reactive to light.  Neck:     Musculoskeletal: Neck supple. No neck rigidity or crepitus.  Cardiovascular:     Rate and Rhythm: Normal rate and regular rhythm.  Pulmonary:     Effort: Pulmonary effort is normal. No respiratory distress.     Breath sounds: Normal breath sounds. No wheezing, rhonchi or rales.  Abdominal:     General: There is no distension.     Palpations: Abdomen is soft.     Tenderness: There is no abdominal tenderness. There is no guarding or rebound.  Skin:    General: Skin is warm and dry.     Findings: No rash.  Neurological:     Mental Status: He is alert.     Comments: Clear speech.   Psychiatric:        Behavior: Behavior normal.    ED Treatments / Results  Labs (all labs ordered are listed, but only abnormal results are displayed) Labs Reviewed  GROUP A STREP BY PCR    EKG None  Radiology No results found.  Procedures Procedures (including critical care time)  Medications Ordered in ED Medications - No data to display   Initial Impression / Assessment and  Plan / ED Course  I have reviewed the triage vital signs and the nursing notes.  Pertinent labs & imaging results that were available during my care of the patient were reviewed by me and considered in my medical decision making (see chart for details).   Patient presents to the emergency department with complaints of sore throat associated with some ear discomfort, feeling hot/cold, and 2 episodes of diarrhea.  Nontoxic-appearing, no apparent distress, vitals WNL with exception of elevated blood pressure, doubt HTN emergency.  Afebrile, no sinus tenderness, doubt acute bacterial sinusitis.  Exam not consistent with AOM, AOE, or  mastoiditis.  No nuchal rigidity.  Strep test is negative.  Exam not consistent with RPA/PTA.  No respiratory complaints. Abdomen is soft nontender without peritoneal signs.  Suspect viral versus allergic.  COVID-19 testing sent.  Discussed need to quarantine.  Will treat supportively. I discussed results, treatment plan, need for follow-up, and return precautions with the patient. Provided opportunity for questions, patient confirmed understanding and is in agreement with plan.    Final Clinical Impressions(s) / ED Diagnoses   Final diagnoses:  Sore throat    ED Discharge Orders         Ordered    fluticasone (FLONASE) 50 MCG/ACT nasal spray  Daily     08/24/19 [address]1706           Destyne Goodreau, [place] ParkSamantha R, PA-C 08/24/19 1708    Maia PlanLong, Joshua G, MD 08/26/19 1156

## 2019-08-25 LAB — NOVEL CORONAVIRUS, NAA (HOSP ORDER, SEND-OUT TO REF LAB; TAT 18-24 HRS): SARS-CoV-2, NAA: NOT DETECTED

## 2019-08-28 ENCOUNTER — Other Ambulatory Visit: Payer: Self-pay

## 2019-08-28 ENCOUNTER — Ambulatory Visit (INDEPENDENT_AMBULATORY_CARE_PROVIDER_SITE_OTHER): Payer: Self-pay | Admitting: Licensed Clinical Social Worker

## 2019-08-28 DIAGNOSIS — F329 Major depressive disorder, single episode, unspecified: Secondary | ICD-10-CM

## 2019-08-28 DIAGNOSIS — F419 Anxiety disorder, unspecified: Secondary | ICD-10-CM

## 2019-08-31 NOTE — BH Specialist Note (Signed)
Integrated Behavioral Health Visit via Telemedicine (Telephone)  08/28/2019 Aaron Alvarado [id number]   Session Start time: 3:00 PM  Session End time: 3:30 PM Total time: 30 minutes  Referring Provider: Dr. Joya Gaskins Type of Visit: Telephonic Patient location: Home [facility] Provider location: Office All persons participating in visit: Pt and LCSW  Confirmed patient's address: Yes  Confirmed patient's phone number: Yes  Any changes to demographics: No   Confirmed patient's insurance: Yes  Any changes to patient's insurance: No   Discussed confidentiality: Yes    The following statements were read to the patient and/or legal guardian that are established with the [facility] Provider.  "The purpose of this phone visit is to provide behavioral health care while limiting exposure to the coronavirus (COVID19).  There is a possibility of technology failure and discussed alternative modes of communication if that failure occurs."  "By engaging in this telephone visit, you consent to the provision of healthcare.  Additionally, you authorize for your insurance to be billed for the services provided during this telephone visit."   Patient and/or legal guardian consented to telephone visit: Yes   PRESENTING CONCERNS: Patient and/or family reports the following symptoms/concerns: Pt reports symptoms of depression and anxiety, including difficulty sleeping, irritability, and hx of trauma. Pt shared that he is often burdened by caring for others, best friend has been hospitalized for three weeks, and ongoing strained relationship with family Duration of problem: Ongoing; Severity of problem: moderate  STRENGTHS (Protective Factors/Coping Skills): Pt is employed Pt has good insight Pt has desire to improve health  GOALS ADDRESSED: Patient will: 1.  Reduce symptoms of: agitation, anxiety and depression  2.  Increase knowledge and/or ability of: coping skills and healthy habits  3.   Demonstrate ability to: Increase adequate support systems for patient/family  INTERVENTIONS: Interventions utilized:  Mindfulness or Psychologist, educational, Supportive Counseling and Psychoeducation and/or Health Education Standardized Assessments completed: Not Needed  ASSESSMENT: Patient currently experiencing depression and anxiety triggered by strained relationships with family and friend's decline in health. He reports limited support system.   Patient may benefit from psychotherapy. He is not interested in medication management. LCSW discussed correlation between one's physical and mental health. Healthy coping skills to assist in management of symptoms discussed.   PLAN: 1. Follow up with behavioral health clinician on : LCSW will follow up with patient in 2 weeks to schedule appt 2. Behavioral recommendations: Utilize strategies discussed 3. Referral(s): [place] (In Clinic)  Rebekah Chesterfield, [place] 08/31/2019 4:44 PM

## 2019-10-08 ENCOUNTER — Ambulatory Visit
Admission: EM | Admit: 2019-10-08 | Discharge: 2019-10-08 | Disposition: A | Discharge status: Home / Self Care | Payer: Medicaid Other | Attending: Physician Assistant | Admitting: Physician Assistant

## 2019-10-08 DIAGNOSIS — L03011 Cellulitis of right finger: Secondary | ICD-10-CM

## 2019-10-08 MED ORDER — MUPIROCIN 2 % EX OINT: 1.0000 "application " | TOPICAL_OINTMENT | Freq: Two times a day (BID) | CUTANEOUS | 0 refills | Status: DC

## 2019-10-08 MED ORDER — CEPHALEXIN 500 MG PO CAPS: 500.0000 mg | ORAL_CAPSULE | Freq: Four times a day (QID) | ORAL | 0 refills | Status: DC

## 2019-10-08 NOTE — Discharge Instructions (Signed)
Start kelfex as directed. You can remove current dressing in 24 hours. Keep wound clean and dry. You can clean gently with soap and water. Do not soak area in water. Monitor for spreading redness, increased warmth, increased swelling, fever, follow up for reevaluation needed.

## 2019-10-08 NOTE — ED Triage Notes (Signed)
Pt c/o rt pinky pain around nail. Redness and swelling noted

## 2019-10-08 NOTE — ED Provider Notes (Signed)
[facility]    CSN: [id number] Arrival date & time: 10/08/19  8466      History   Chief Complaint Chief Complaint  Patient presents with  . Hand Pain    HPI Aaron Alvarado is a 23 y.o. male.   23 year old male comes in for 1 week history of right 5th finger pain and swelling. He removed a hang nail prior to symptoms starting. States since symptom onset, has had erythema, though not spreading. Denies fever, chills, body aches. Has tried abx cream, salt water soaks without relief.      Past Medical History:  Diagnosis Date  . Asthma   . Blount's disease   . Environmental allergies   . Hypertension   . PTSD (post-traumatic stress disorder)     Patient Active Problem List   Diagnosis Date Noted  . Generalized anxiety disorder 08/16/2019  . Therapeutic drug monitoring 01/17/2018  . High risk sexual behavior 10/26/2017  . 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 HTN (hypertension) 10/26/2011  . History of acanthosis skin now resolved 2015 10/26/2011  . GERD (gastroesophageal reflux disease) 05/06/2011  . Obesity (BMI 35.0-39.9 without comorbidity) 04/10/2010  . Allergic rhinitis 01/12/2007    Past Surgical History:  Procedure Laterality Date  . KNEE ARTHROSCOPY  2011       Home Medications    Prior to Admission medications   Medication Sig Start Date End Date Taking? Authorizing Provider  cephALEXin (KEFLEX) 500 MG capsule Take 1 capsule (500 mg total) by mouth 4 (four) times daily. 10/08/19   Cathie Hoops, Amy V, PA-C  cetirizine (ZYRTEC) 10 MG tablet Take 1 tablet (10 mg total) by mouth daily. 08/16/19   Storm Frisk, MD  emtricitabine-tenofovir (TRUVADA) 200-300 MG tablet Take 1 tablet by mouth daily. 08/16/19   Storm Frisk, MD  fluticasone (FLONASE) 50 MCG/ACT nasal spray Place 1 spray into both nostrils daily. 08/24/19   Petrucelli, Samantha R, PA-C  losartan (COZAAR) 25 MG tablet Take 1 tablet (25 mg total) by mouth daily. 06/01/19   Bing Neighbors,  FNP  mupirocin ointment (BACTROBAN) 2 % Apply 1 application topically 2 (two) times daily. 10/08/19   Cathie Hoops, Amy V, PA-C  omeprazole (PRILOSEC) 20 MG capsule Take 1 capsule (20 mg total) by mouth daily. 08/16/19   Storm Frisk, MD  PROAIR HFA [phone] Base) MCG/ACT inhaler INHALE 2-4 PUFFS INTO THE LUNGS AS NEEDED FOR WHEEZING 02/13/18   Nestor Ramp, MD    Family History Family History  Problem Relation Age of Onset  . Hypertension Mother   . Fibromyalgia Mother     Social History Social History   Tobacco Use  . Smoking status: Never Smoker  . Smokeless tobacco: Current User  Substance Use Topics  . Alcohol use: Yes    Comment: weekly  . Drug use: Not Currently     Allergies   Penicillins   Review of Systems Review of Systems  Reason unable to perform ROS: See HPI as above.     Physical Exam Triage Vital Signs ED Triage Vitals  Enc Vitals Group     BP 10/08/19 0935 (!) 144/97     Pulse Rate 10/08/19 0935 60     Resp 10/08/19 0935 18     Temp 10/08/19 0935 98.1 F (36.7 C)     Temp Source 10/08/19 0935 Oral     SpO2 10/08/19 0935 97 %     Weight --  Height --      Head Circumference --      Peak Flow --      Pain Score 10/08/19 0937 6     Pain Loc --      Pain Edu? --      Excl. in [place]? --    No data found.  Updated Vital Signs BP (!) 144/97 (BP Location: Left Arm)   Pulse 60   Temp 98.1 F (36.7 C) (Oral)   Resp 18   SpO2 97%   Physical Exam Constitutional:      General: He is not in acute distress.    Appearance: He is well-developed. He is not diaphoretic.  HENT:     Head: Normocephalic and atraumatic.  Eyes:     Conjunctiva/sclera: Conjunctivae normal.     Pupils: Pupils are equal, round, and reactive to light.  Pulmonary:     Effort: Pulmonary effort is normal. No respiratory distress.  Musculoskeletal:     Comments: Swelling with erythema and paronychia to the right 5th finger nailbed. Painful palpation. Full ROM. Sensation intact.  Cap refill<2s  Skin:    General: Skin is warm and dry.  Neurological:     Mental Status: He is alert and oriented to person, place, and time.      UC Treatments / Results  Labs (all labs ordered are listed, but only abnormal results are displayed) Labs Reviewed - No data to display  EKG   Radiology No results found.  Procedures Incision and Drainage  Date/Time: 10/08/2019 10:53 AM Performed by: Ok Edwards, PA-C Authorized by: Ok Edwards, PA-C   Consent:    Consent obtained:  Verbal   Consent given by:  Patient   Risks discussed:  Bleeding, incomplete drainage, infection and pain   Alternatives discussed:  Alternative treatment, referral and no treatment Location:    Type:  Abscess   Size:  0.5cm x 0.2cm   Location:  Upper extremity   Upper extremity location:  Finger   Finger location:  R small finger Pre-procedure details:    Skin preparation:  Chloraprep Anesthesia (see MAR for exact dosages):    Anesthesia method:  Topical application   Topical anesthesia: freezing spray. Procedure type:    Complexity:  Simple Procedure details:    Needle aspiration: no     Incision types:  Stab incision   Incision depth:  Dermal   Scalpel blade:  11   Wound management:  Irrigated with saline   Drainage:  Purulent   Drainage amount:  Moderate   Wound treatment:  Wound left open   Packing materials:  None Post-procedure details:    Patient tolerance of procedure:  Tolerated well, no immediate complications   (including critical care time)  Medications Ordered in UC Medications - No data to display  Initial Impression / Assessment and Plan / UC Course  I have reviewed the triage vital signs and the nursing notes.  Pertinent labs & imaging results that were available during my care of the patient were reviewed by me and considered in my medical decision making (see chart for details).    Patient tolerated procedure well. Start keflex for surrounding cellulitis.  Patient with history of PCN allergy with hives, tolerated rocephin in the past without problems. Wound care instructions given. Return precautions given. Patient expresses understanding and agrees to plan.   Final Clinical Impressions(s) / UC Diagnoses   Final diagnoses:  Paronychia of right little finger    ED Prescriptions  Medication Sig Dispense Auth. Provider   cephALEXin (KEFLEX) 500 MG capsule Take 1 capsule (500 mg total) by mouth 4 (four) times daily. 28 capsule Yu, Amy V, PA-C   mupirocin ointment (BACTROBAN) 2 % Apply 1 application topically 2 (two) times daily. 22 g Belinda FisherYu, Amy V, PA-C     PDMP not reviewed this encounter.   Belinda FisherYu, Amy V, PA-C 10/08/19 1055

## 2019-11-02 ENCOUNTER — Other Ambulatory Visit: Payer: Self-pay | Admitting: Cardiology

## 2019-11-02 DIAGNOSIS — Z20822 Contact with and (suspected) exposure to covid-19: Secondary | ICD-10-CM

## 2019-11-04 LAB — NOVEL CORONAVIRUS, NAA: SARS-CoV-2, NAA: NOT DETECTED

## 2019-11-21 ENCOUNTER — Telehealth: Payer: Self-pay

## 2019-11-21 NOTE — Telephone Encounter (Signed)
Called patient to do their pre-visit COVID screening.  Call went to voicemail. Unable to do prescreening.  

## 2019-11-22 ENCOUNTER — Ambulatory Visit: Payer: Medicaid Other

## 2019-12-06 ENCOUNTER — Other Ambulatory Visit: Payer: Self-pay | Admitting: Cardiology

## 2019-12-06 DIAGNOSIS — Z20822 Contact with and (suspected) exposure to covid-19: Secondary | ICD-10-CM

## 2019-12-07 LAB — NOVEL CORONAVIRUS, NAA: SARS-CoV-2, NAA: NOT DETECTED

## 2020-02-26 ENCOUNTER — Other Ambulatory Visit: Payer: Self-pay

## 2020-02-26 ENCOUNTER — Ambulatory Visit
Admission: EM | Admit: 2020-02-26 | Discharge: 2020-02-26 | Disposition: A | Discharge status: Home / Self Care | Payer: Self-pay | Attending: Physician Assistant | Admitting: Physician Assistant

## 2020-02-26 DIAGNOSIS — R05 Cough: Secondary | ICD-10-CM

## 2020-02-26 DIAGNOSIS — R0981 Nasal congestion: Secondary | ICD-10-CM

## 2020-02-26 DIAGNOSIS — R059 Cough, unspecified: Secondary | ICD-10-CM

## 2020-02-26 DIAGNOSIS — Z20822 Contact with and (suspected) exposure to covid-19: Secondary | ICD-10-CM

## 2020-02-26 DIAGNOSIS — J029 Acute pharyngitis, unspecified: Secondary | ICD-10-CM

## 2020-02-26 MED ORDER — AZELASTINE HCL 0.1 % NA SOLN: 2.0000 | Freq: Two times a day (BID) | NASAL | 0 refills | Status: DC

## 2020-02-26 NOTE — ED Provider Notes (Signed)
[facility]    CSN: [id number] Arrival date & time: 02/26/20  1126      History   Chief Complaint Chief Complaint  Patient presents with  . Facial Pain    HPI Aaron Alvarado is a 24 y.o. male.   24 year old male comes in for 3 day of URI symptoms. Has had throat irritation, watery eyes, right ear pain, nasal irritation. States initially had cough and central chest pain that resolved after otc cold medicine. Denies fever, chills, body aches. Diarrhea that resolved. Denies abdominal pain, nausea, vomiting. Denies shortness of breath, loss of taste/smell. Never smoker. Takes daily flonase, zyrtec.      Past Medical History:  Diagnosis Date  . Asthma   . Blount's disease   . Environmental allergies   . Hypertension   . PTSD (post-traumatic stress disorder)     Patient Active Problem List   Diagnosis Date Noted  . Generalized anxiety disorder 08/16/2019  . Therapeutic drug monitoring 01/17/2018  . High risk sexual behavior 10/26/2017  . 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 HTN (hypertension) 10/26/2011  . History of acanthosis skin now resolved 2015 10/26/2011  . GERD (gastroesophageal reflux disease) 05/06/2011  . Obesity (BMI 35.0-39.9 without comorbidity) 04/10/2010  . Allergic rhinitis 01/12/2007    Past Surgical History:  Procedure Laterality Date  . KNEE ARTHROSCOPY  2011       Home Medications    Prior to Admission medications   Medication Sig Start Date End Date Taking? Authorizing Provider  azelastine (ASTELIN) 0.1 % nasal spray Place 2 sprays into both nostrils 2 (two) times daily. 02/26/20   Cathie Hoops, Keatin Benham V, PA-C  cetirizine (ZYRTEC) 10 MG tablet Take 1 tablet (10 mg total) by mouth daily. 08/16/19   Storm Frisk, MD  fluticasone (FLONASE) 50 MCG/ACT nasal spray Place 1 spray into both nostrils daily. 08/24/19   Petrucelli, Pleas Koch, PA-C    Family History Family History  Problem Relation Age of Onset  . Hypertension Mother   .  Fibromyalgia Mother     Social History Social History   Tobacco Use  . Smoking status: Never Smoker  . Smokeless tobacco: Current User  Substance Use Topics  . Alcohol use: Yes    Comment: weekly  . Drug use: Not Currently     Allergies   Penicillins   Review of Systems Review of Systems  Reason unable to perform ROS: See HPI as above.     Physical Exam Triage Vital Signs ED Triage Vitals  Enc Vitals Group     BP 02/26/20 1204 (!) 140/95     Pulse Rate 02/26/20 1204 (!) 55     Resp 02/26/20 1204 18     Temp 02/26/20 1204 97.9 F (36.6 C)     Temp Source 02/26/20 1204 Oral     SpO2 02/26/20 1204 98 %     Weight --      Height --      Head Circumference --      Peak Flow --      Pain Score 02/26/20 1211 6     Pain Loc --      Pain Edu? --      Excl. in [place]? --    No data found.  Updated Vital Signs BP (!) 140/95 (BP Location: Left Arm)   Pulse (!) 55   Temp 97.9 F (36.6 C) (Oral)   Resp 18   SpO2 98%   Physical Exam Constitutional:  General: He is not in acute distress.    Appearance: Normal appearance. He is not ill-appearing, toxic-appearing or diaphoretic.  HENT:     Head: Normocephalic and atraumatic.     Right Ear: Tympanic membrane, ear canal and external ear normal. Tympanic membrane is not erythematous or bulging.     Left Ear: Tympanic membrane, ear canal and external ear normal. Tympanic membrane is not erythematous or bulging.     Nose:     Right Sinus: Maxillary sinus tenderness and frontal sinus tenderness present.     Left Sinus: Maxillary sinus tenderness and frontal sinus tenderness present.     Mouth/Throat:     Mouth: Mucous membranes are moist.     Pharynx: Oropharynx is clear. Uvula midline.  Cardiovascular:     Rate and Rhythm: Normal rate and regular rhythm.     Heart sounds: Normal heart sounds. No murmur. No friction rub. No gallop.   Pulmonary:     Effort: Pulmonary effort is normal. No accessory muscle usage,  prolonged expiration, respiratory distress or retractions.     Comments: Lungs clear to auscultation without adventitious lung sounds. Musculoskeletal:     Cervical back: Normal range of motion and neck supple.  Neurological:     General: No focal deficit present.     Mental Status: He is alert and oriented to person, place, and time.     UC Treatments / Results  Labs (all labs ordered are listed, but only abnormal results are displayed) Labs Reviewed  NOVEL CORONAVIRUS, NAA    EKG   Radiology No results found.  Procedures Procedures (including critical care time)  Medications Ordered in UC Medications - No data to display  Initial Impression / Assessment and Plan / UC Course  I have reviewed the triage vital signs and the nursing notes.  Pertinent labs & imaging results that were available during my care of the patient were reviewed by me and considered in my medical decision making (see chart for details).    COVID PCR test ordered. Patient to quarantine until testing results return. No alarming signs on exam.  Patient speaking in full sentences without respiratory distress.  Symptomatic treatment discussed.  Push fluids.  Return precautions given.  Patient expresses understanding and agrees to plan.  Final Clinical Impressions(s) / UC Diagnoses   Final diagnoses:  Cough  Nasal congestion  Sore throat    ED Prescriptions    Medication Sig Dispense Auth. Provider   azelastine (ASTELIN) 0.1 % nasal spray Place 2 sprays into both nostrils 2 (two) times daily. 30 mL Ok Edwards, PA-C     PDMP not reviewed this encounter.   Ok Edwards, PA-C 02/26/20 1240

## 2020-02-26 NOTE — Discharge Instructions (Addendum)
COVID PCR testing ordered. I would like you to quarantine until testing results. Continue flonase and zyrtec. Start azelastine. Tylenol/motrin for pain and fever. Keep hydrated, urine should be clear to pale yellow in color. If experiencing shortness of breath, trouble breathing, go to the emergency department for further evaluation needed.   For sore throat/cough try using a honey-based tea. Use 3 teaspoons of honey with juice squeezed from half lemon. Place shaved pieces of ginger into 1/2-1 cup of water and warm over stove top. Then mix the ingredients and repeat every 4 hours as needed.

## 2020-02-26 NOTE — ED Triage Notes (Signed)
Pt c/o throat burning, watery eyes, rt ear pain, and nose burning since Saturday. Pt c/o cough and center chest pain on Sunday that has subsided with OTC meds.

## 2020-02-27 LAB — NOVEL CORONAVIRUS, NAA: SARS-CoV-2, NAA: DETECTED — AB

## 2020-02-27 LAB — SARS-COV-2, NAA 2 DAY TAT

## 2020-02-28 ENCOUNTER — Encounter: Payer: Self-pay | Admitting: Nurse Practitioner

## 2020-02-28 ENCOUNTER — Telehealth (HOSPITAL_COMMUNITY): Payer: Self-pay | Admitting: Nurse Practitioner

## 2020-02-28 NOTE — Telephone Encounter (Signed)
Called to discuss with Aaron Alvarado about Covid symptoms and the use of bamlanivimab, a monoclonal antibody infusion for those with mild to moderate Covid symptoms and at a high risk of hospitalization.     Pt is qualified for this infusion at the [facility] infusion center due to co-morbid conditions and/or a member of an at-risk group, however declines infusion at this time. Symptoms tier reviewed as well as criteria for ending isolation.  Symptoms reviewed that would warrant ED/Hospital evaluation. Preventative practices reviewed. Patient verbalized understanding. Patient advised to call back if he decides that he does want to get infusion. Callback number to the infusion center given. Patient advised to go to Urgent care or ED with severe symptoms. Last date he would be eligible for infusion is 03/05/2020.     Patient Active Problem List   Diagnosis Date Noted  . Generalized anxiety disorder 08/16/2019  . Therapeutic drug monitoring 01/17/2018  . High risk sexual behavior 10/26/2017  . 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 HTN (hypertension) 10/26/2011  . History of acanthosis skin now resolved 2015 10/26/2011  . GERD (gastroesophageal reflux disease) 05/06/2011  . Obesity (BMI 35.0-39.9 without comorbidity) 04/10/2010  . Allergic rhinitis 01/12/2007    Consuello Masse, DNP, AGNP-C [facility]  [name] [phone] (Infusion Center Hotline)

## 2020-08-09 ENCOUNTER — Other Ambulatory Visit: Payer: Self-pay

## 2020-08-09 ENCOUNTER — Emergency Department (HOSPITAL_BASED_OUTPATIENT_CLINIC_OR_DEPARTMENT_OTHER)
Admission: EM | Admit: 2020-08-09 | Discharge: 2020-08-09 | Disposition: A | Discharge status: Home / Self Care | Payer: Medicaid Other | Attending: Emergency Medicine | Admitting: Emergency Medicine

## 2020-08-09 ENCOUNTER — Encounter (HOSPITAL_BASED_OUTPATIENT_CLINIC_OR_DEPARTMENT_OTHER): Payer: Self-pay | Admitting: Emergency Medicine

## 2020-08-09 DIAGNOSIS — J45909 Unspecified asthma, uncomplicated: Secondary | ICD-10-CM | POA: Insufficient documentation

## 2020-08-09 DIAGNOSIS — X58XXXA Exposure to other specified factors, initial encounter: Secondary | ICD-10-CM | POA: Insufficient documentation

## 2020-08-09 DIAGNOSIS — S39012A Strain of muscle, fascia and tendon of lower back, initial encounter: Secondary | ICD-10-CM | POA: Insufficient documentation

## 2020-08-09 DIAGNOSIS — I10 Essential (primary) hypertension: Secondary | ICD-10-CM | POA: Insufficient documentation

## 2020-08-09 MED ORDER — METHOCARBAMOL 500 MG PO TABS: 500.0000 mg | ORAL_TABLET | Freq: Two times a day (BID) | ORAL | 0 refills | Status: DC

## 2020-08-09 MED ORDER — IBUPROFEN 800 MG PO TABS
800.0000 mg | ORAL_TABLET | Freq: Once | ORAL | Status: AC
  Administered 2020-08-09: 800 mg via ORAL
  Filled 2020-08-09: qty 1

## 2020-08-09 MED ORDER — IBUPROFEN 800 MG PO TABS: 800.0000 mg | ORAL_TABLET | Freq: Three times a day (TID) | ORAL | 0 refills | Status: DC | PRN

## 2020-08-09 NOTE — ED Triage Notes (Signed)
Left sided lower back pain from moving boxes yesterday. Pain even at rest.

## 2020-08-09 NOTE — ED Provider Notes (Signed)
[facility] EMERGENCY DEPARTMENT Provider Note   CSN: [id number] Arrival date & time: 08/09/20  0749     History Chief Complaint  Patient presents with  . Back Pain    Aaron Alvarado is a 24 y.o. male.  Pt presents to the ED today with left sided back pain.  Pt said he was helping a friend move yesterday and developed pain in his back last night.  He denies any shooting pain or numbness in his leg.  He denies bowel or bladder problems.  Pt said it hurts to move.  He is able to ambulate.        Past Medical History:  Diagnosis Date  . Asthma   . Blount's disease   . Environmental allergies   . Hypertension   . PTSD (post-traumatic stress disorder)     Patient Active Problem List   Diagnosis Date Noted  . Generalized anxiety disorder 08/16/2019  . Therapeutic drug monitoring 01/17/2018  . High risk sexual behavior 10/26/2017  . 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 HTN (hypertension) 10/26/2011  . History of acanthosis skin now resolved 2015 10/26/2011  . GERD (gastroesophageal reflux disease) 05/06/2011  . Obesity (BMI 35.0-39.9 without comorbidity) 04/10/2010  . Allergic rhinitis 01/12/2007    Past Surgical History:  Procedure Laterality Date  . KNEE ARTHROSCOPY  2011       Family History  Problem Relation Age of Onset  . Hypertension Mother   . Fibromyalgia Mother     Social History   Tobacco Use  . Smoking status: Never Smoker  . Smokeless tobacco: Current User  Vaping Use  . Vaping Use: Never used  Substance Use Topics  . Alcohol use: Yes    Comment: weekly  . Drug use: Not Currently    Home Medications Prior to Admission medications   Medication Sig Start Date End Date Taking? Authorizing Provider  azelastine (ASTELIN) 0.1 % nasal spray Place 2 sprays into both nostrils 2 (two) times daily. 02/26/20   Cathie Hoops, Amy V, PA-C  cetirizine (ZYRTEC) 10 MG tablet Take 1 tablet (10 mg total) by mouth daily. 08/16/19   Storm Frisk, MD    fluticasone (FLONASE) 50 MCG/ACT nasal spray Place 1 spray into both nostrils daily. 08/24/19   Petrucelli, Samantha R, PA-C  ibuprofen (ADVIL) 800 MG tablet Take 1 tablet (800 mg total) by mouth every 8 (eight) hours as needed. 08/09/20   Jacalyn Lefevre, MD  LOSARTAN POTASSIUM PO Take by mouth.    [provider]  methocarbamol (ROBAXIN) 500 MG tablet Take 1 tablet (500 mg total) by mouth 2 (two) times daily. 08/09/20   Jacalyn Lefevre, MD    Allergies    Penicillins  Review of Systems   Review of Systems  Musculoskeletal: Positive for back pain.  All other systems reviewed and are negative.   Physical Exam Updated Vital Signs BP (!) 146/103 (BP Location: Right Arm)   Pulse 71   Temp 98.4 F (36.9 C) (Oral)   Resp 16   SpO2 100%   Physical Exam Vitals and nursing note reviewed.  Constitutional:      Appearance: Normal appearance.  HENT:     Head: Normocephalic and atraumatic.     Right Ear: External ear normal.     Left Ear: External ear normal.     Nose: Nose normal.     Mouth/Throat:     Mouth: Mucous membranes are moist.     Pharynx: Oropharynx is clear.  Eyes:  Extraocular Movements: Extraocular movements intact.     Conjunctiva/sclera: Conjunctivae normal.     Pupils: Pupils are equal, round, and reactive to light.  Cardiovascular:     Rate and Rhythm: Normal rate.     Pulses: Normal pulses.  Pulmonary:     Effort: Pulmonary effort is normal.     Breath sounds: Normal breath sounds.  Abdominal:     General: Abdomen is flat.  Musculoskeletal:     Cervical back: Normal range of motion.       Back:  Skin:    General: Skin is warm.     Capillary Refill: Capillary refill takes less than 2 seconds.  Neurological:     General: No focal deficit present.     Mental Status: He is alert and oriented to person, place, and time.  Psychiatric:        Mood and Affect: Mood normal.        Behavior: Behavior normal.     ED Results / Procedures /  Treatments   Labs (all labs ordered are listed, but only abnormal results are displayed) Labs Reviewed - No data to display  EKG None  Radiology No results found.  Procedures Procedures (including critical care time)  Medications Ordered in ED Medications - No data to display  ED Course  I have reviewed the triage vital signs and the nursing notes.  Pertinent labs & imaging results that were available during my care of the patient were reviewed by me and considered in my medical decision making (see chart for details).    MDM Rules/Calculators/A&P                          Back pain is msk in nature.  No radicular sx.  No red flags.  Pt d/c home with ibuprofen/robaxin and is given back exercises and stretches.  He is given a note for work.  Return if worse.  Final Clinical Impression(s) / ED Diagnoses Final diagnoses:  Strain of lumbar region, initial encounter    Rx / DC Orders ED Discharge Orders         Ordered    ibuprofen (ADVIL) 800 MG tablet  Every 8 hours PRN        08/09/20 0817    methocarbamol (ROBAXIN) 500 MG tablet  2 times daily        08/09/20 6045           Jacalyn Lefevre, MD 08/09/20 [phone]

## 2020-08-09 NOTE — ED Notes (Addendum)
Pt requests meds be sent to [facility] [place]

## 2020-08-09 NOTE — ED Notes (Signed)
Pt discharged to home. Discharge instructions have been discussed with patient and/or family members. Pt verbally acknowledges understanding d/c instructions, and endorses comprehension to checkout at registration before leaving.  °

## 2021-01-23 ENCOUNTER — Emergency Department (HOSPITAL_BASED_OUTPATIENT_CLINIC_OR_DEPARTMENT_OTHER): Payer: No Typology Code available for payment source

## 2021-01-23 ENCOUNTER — Encounter (HOSPITAL_BASED_OUTPATIENT_CLINIC_OR_DEPARTMENT_OTHER): Payer: Self-pay

## 2021-01-23 ENCOUNTER — Emergency Department (HOSPITAL_BASED_OUTPATIENT_CLINIC_OR_DEPARTMENT_OTHER)
Admission: EM | Admit: 2021-01-23 | Discharge: 2021-01-23 | Disposition: A | Discharge status: Home / Self Care | Payer: No Typology Code available for payment source | Attending: Emergency Medicine | Admitting: Emergency Medicine

## 2021-01-23 ENCOUNTER — Other Ambulatory Visit: Payer: Self-pay

## 2021-01-23 DIAGNOSIS — S161XXA Strain of muscle, fascia and tendon at neck level, initial encounter: Secondary | ICD-10-CM | POA: Insufficient documentation

## 2021-01-23 DIAGNOSIS — Y92410 Unspecified street and highway as the place of occurrence of the external cause: Secondary | ICD-10-CM | POA: Diagnosis not present

## 2021-01-23 DIAGNOSIS — I10 Essential (primary) hypertension: Secondary | ICD-10-CM | POA: Diagnosis not present

## 2021-01-23 DIAGNOSIS — J45909 Unspecified asthma, uncomplicated: Secondary | ICD-10-CM | POA: Insufficient documentation

## 2021-01-23 DIAGNOSIS — S7002XA Contusion of left hip, initial encounter: Secondary | ICD-10-CM | POA: Insufficient documentation

## 2021-01-23 DIAGNOSIS — S39012A Strain of muscle, fascia and tendon of lower back, initial encounter: Secondary | ICD-10-CM | POA: Diagnosis not present

## 2021-01-23 DIAGNOSIS — S169XXA Unspecified injury of muscle, fascia and tendon at neck level, initial encounter: Secondary | ICD-10-CM | POA: Diagnosis present

## 2021-01-23 IMAGING — DX DG CERVICAL SPINE COMPLETE 4+V
6 series · 6 of 6 positions shown · non-contrast
Comparison: None.

CLINICAL DATA: Pain

EXAM:
CERVICAL SPINE - COMPLETE 4+ VIEW

[w c-spine lat]
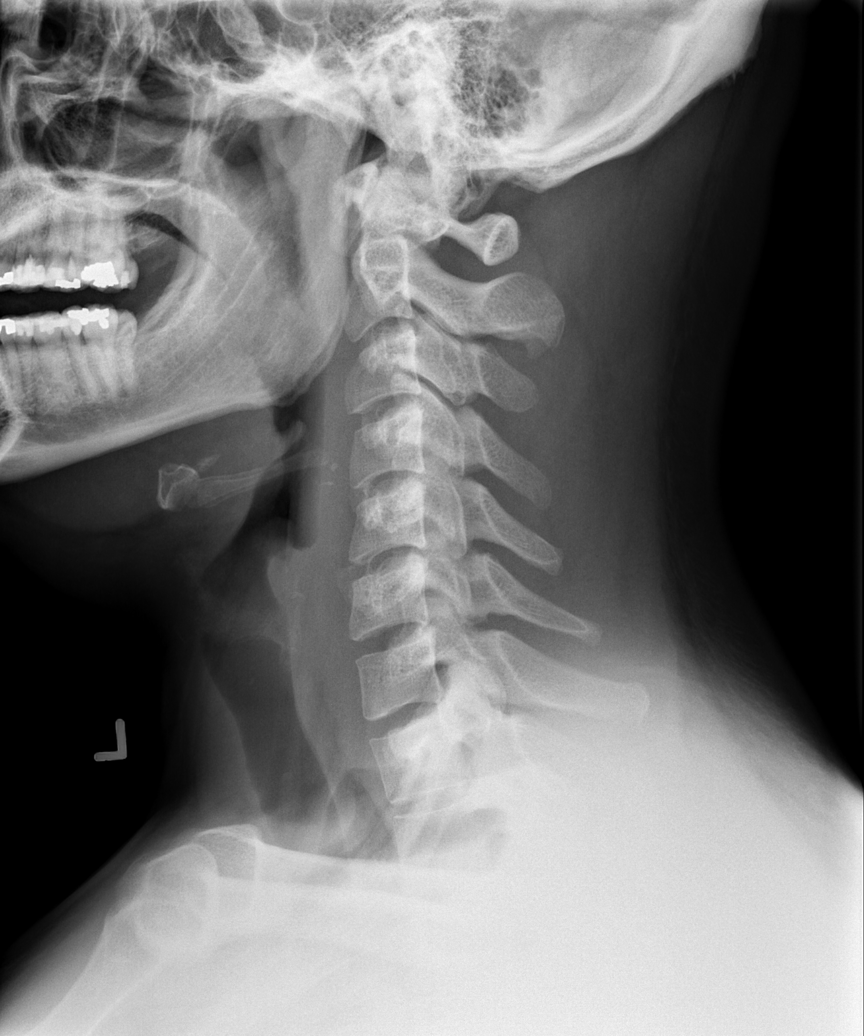

[w c-spine a.p.]
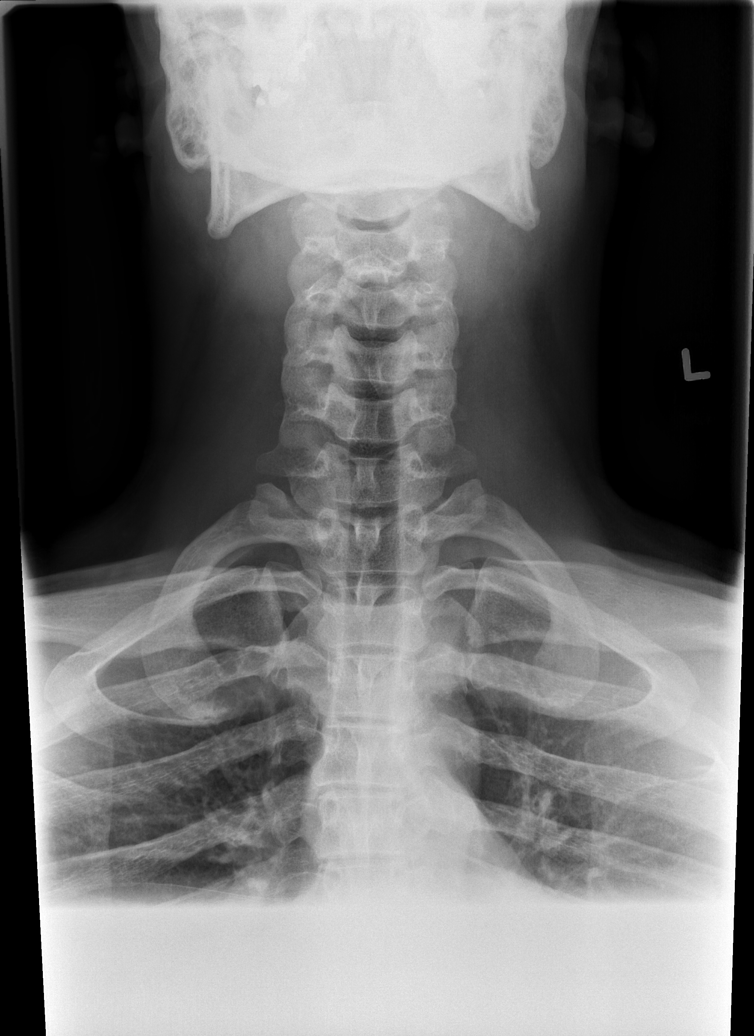

[c-spine open mouth]
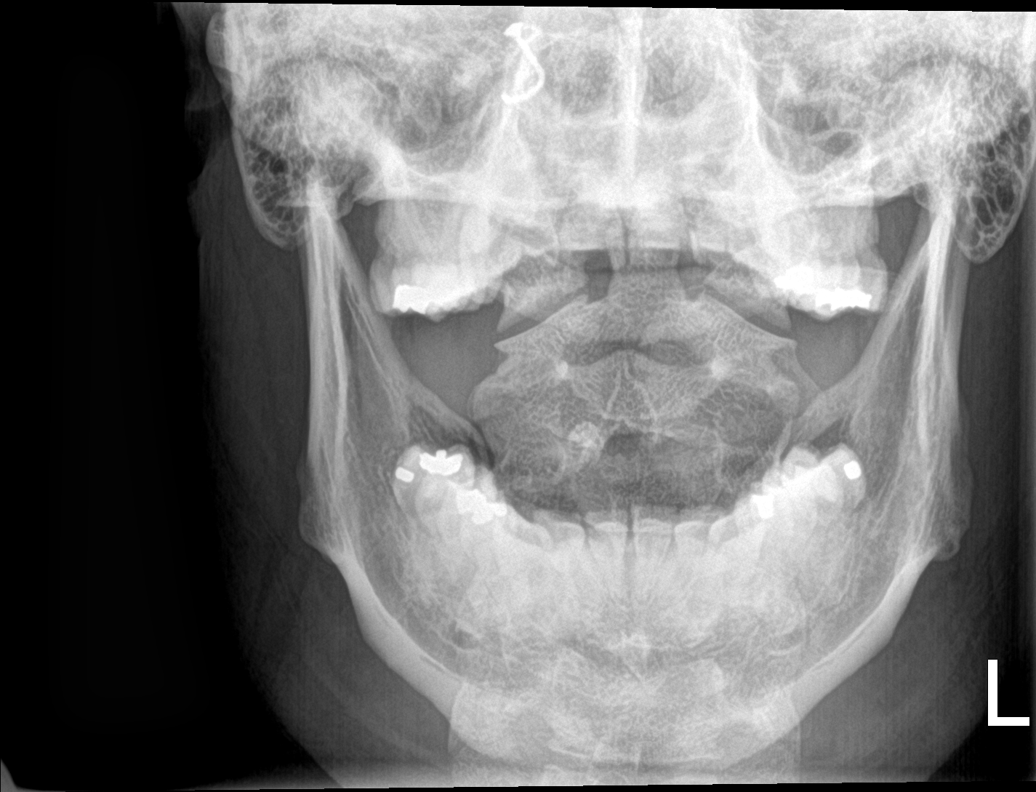

[c-spine obl (1 of 2)]
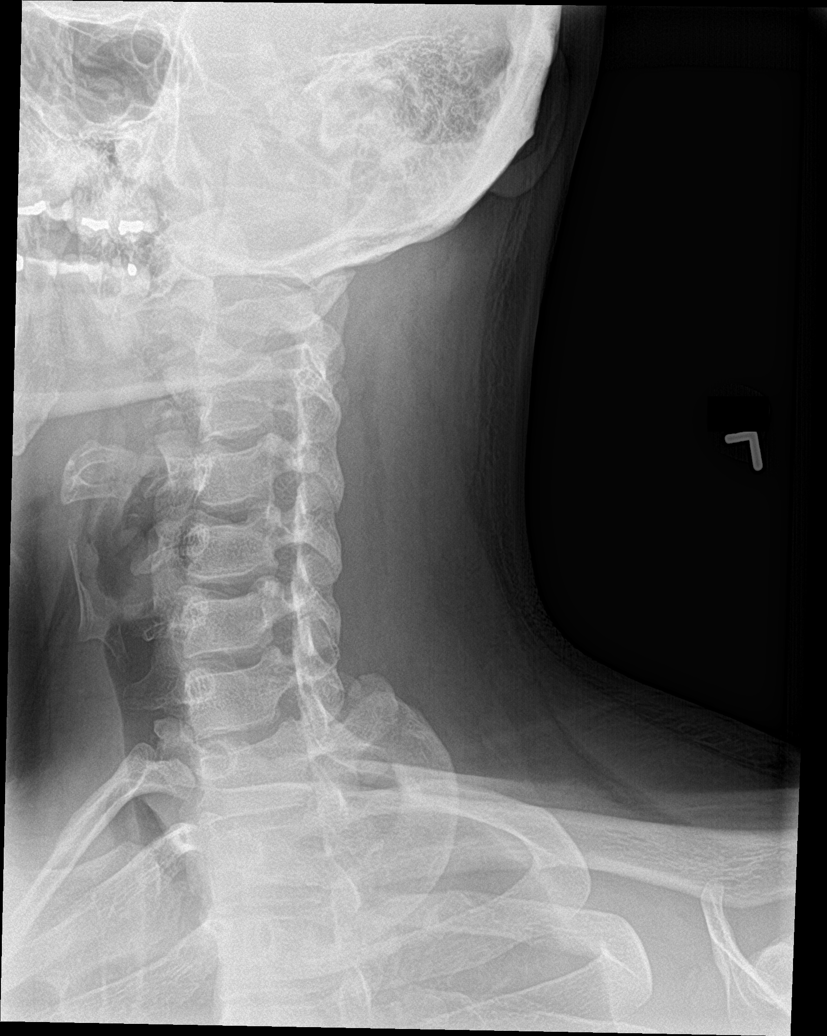

[c-spine obl (2 of 2)]
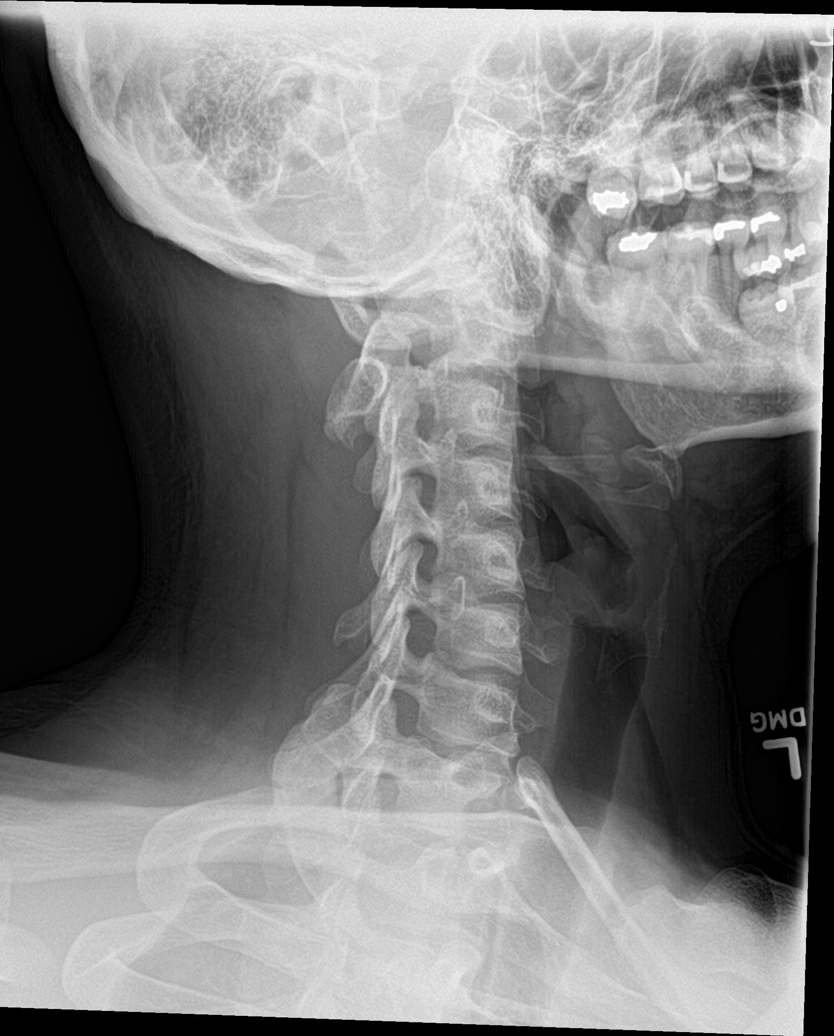

[c-spine fuchs]
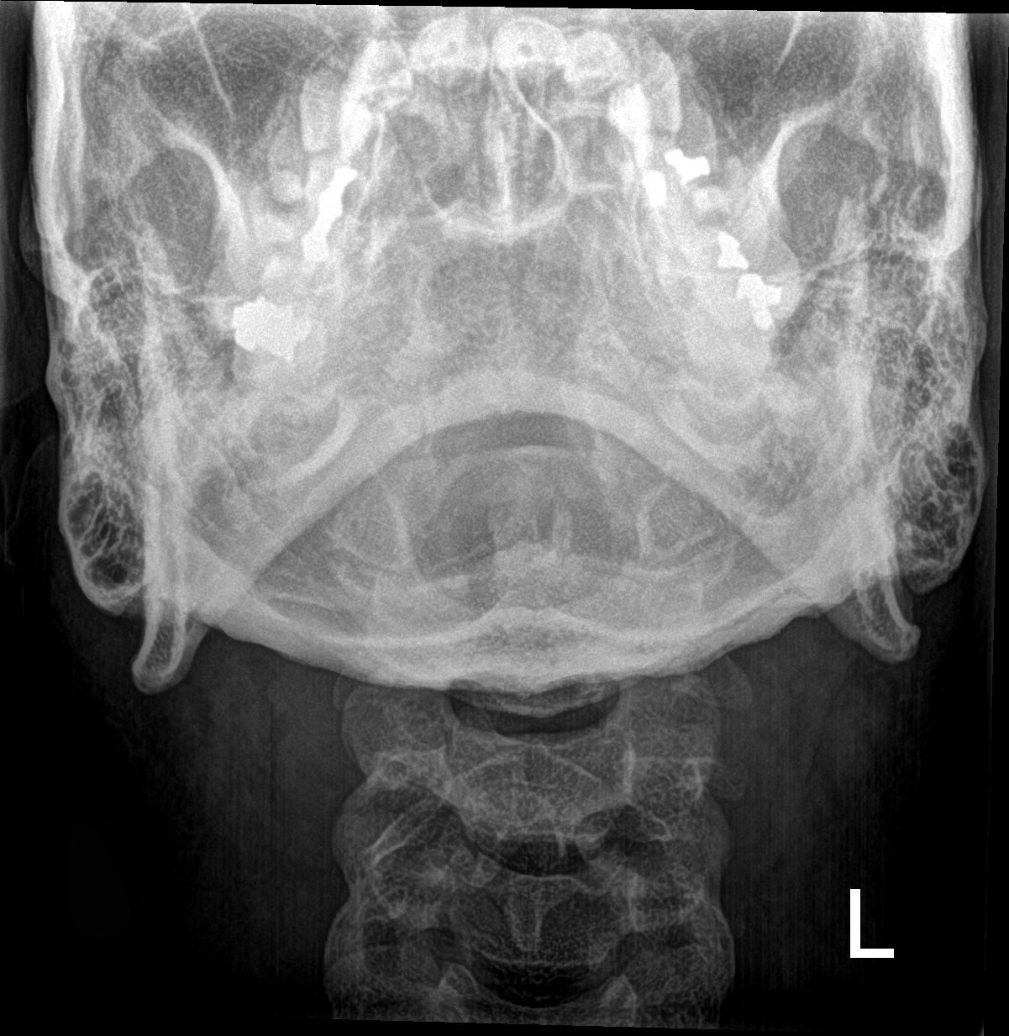

[6 of 6 positions shown; findings below may reference images not displayed]

FINDINGS: There is no evidence of cervical spine fracture or prevertebral soft
tissue swelling. Alignment is normal. No other significant bone
abnormalities are identified.
IMPRESSION: Negative cervical spine radiographs.

## 2021-01-23 IMAGING — DX DG LUMBAR SPINE COMPLETE 4+V
5 series · 5 of 5 positions shown · non-contrast
Comparison: None.

CLINICAL DATA: Pain

EXAM:
LUMBAR SPINE - COMPLETE 4+ VIEW

[l-spine ap]
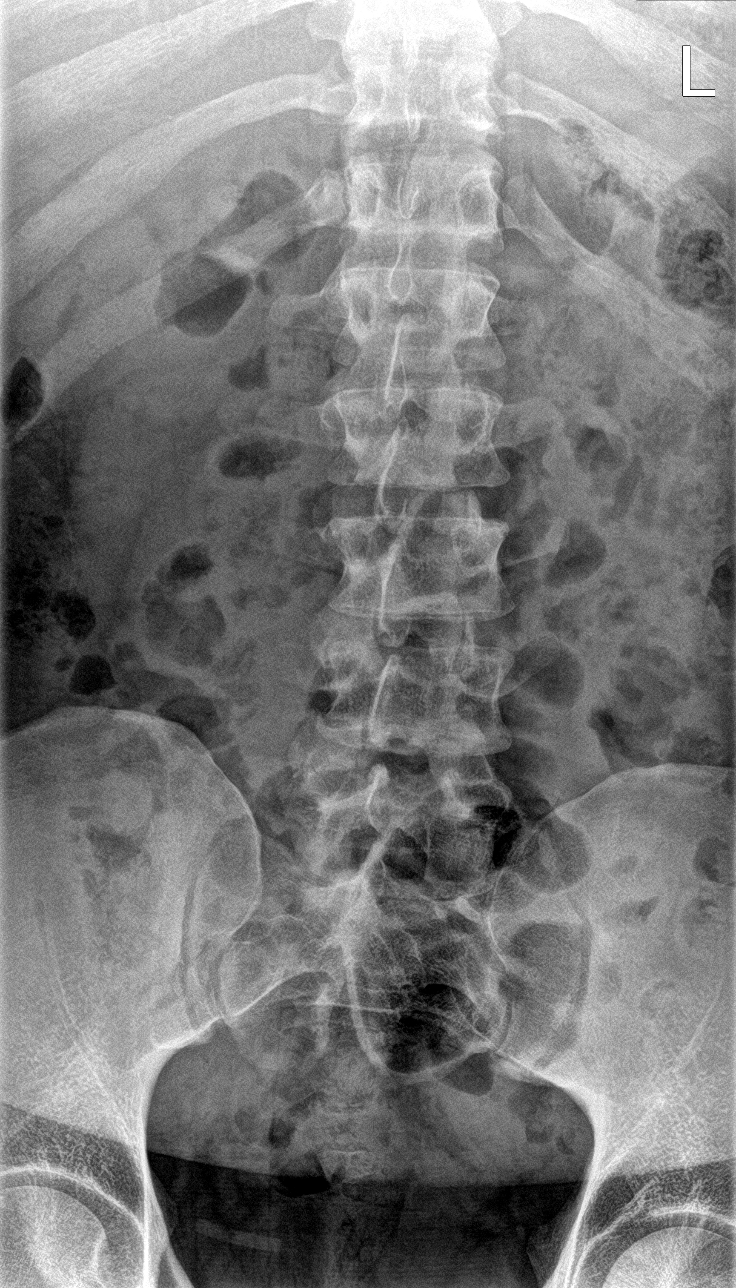

[l-spine lat]
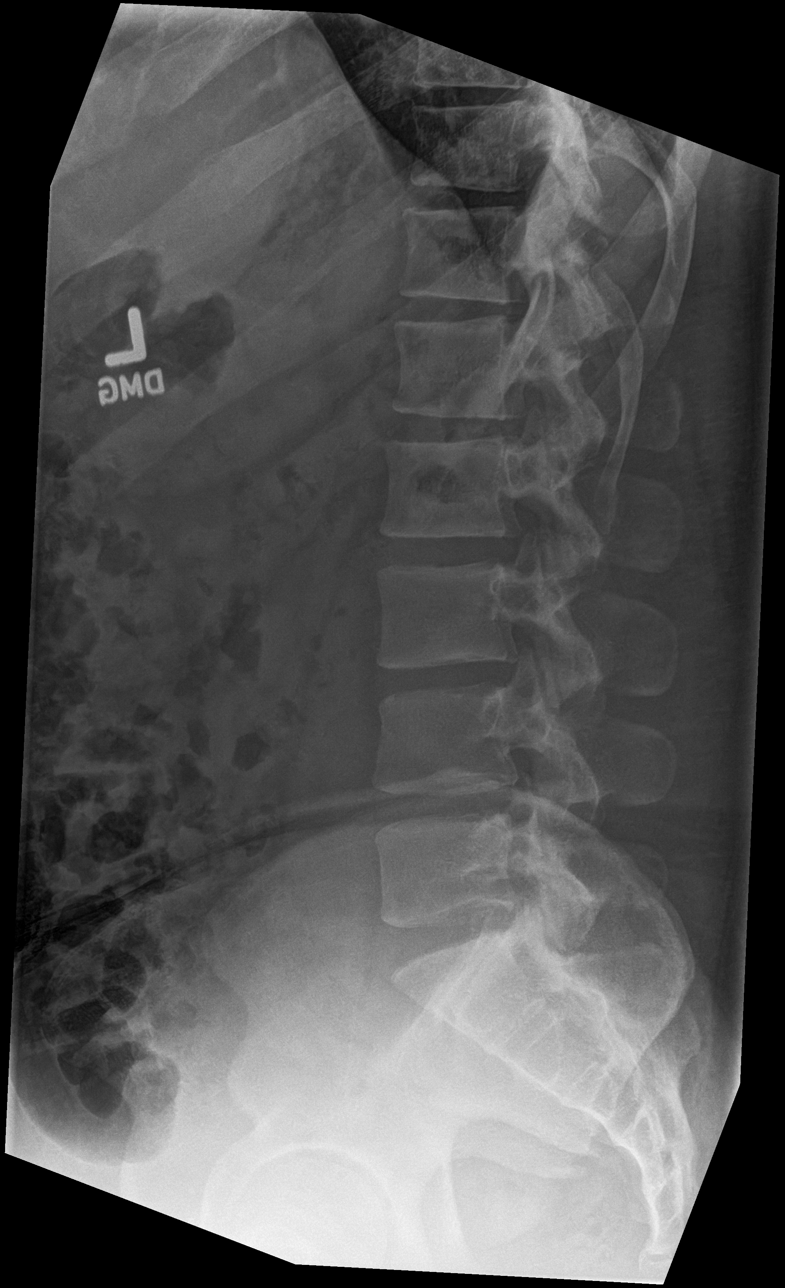

[l-spine spot]
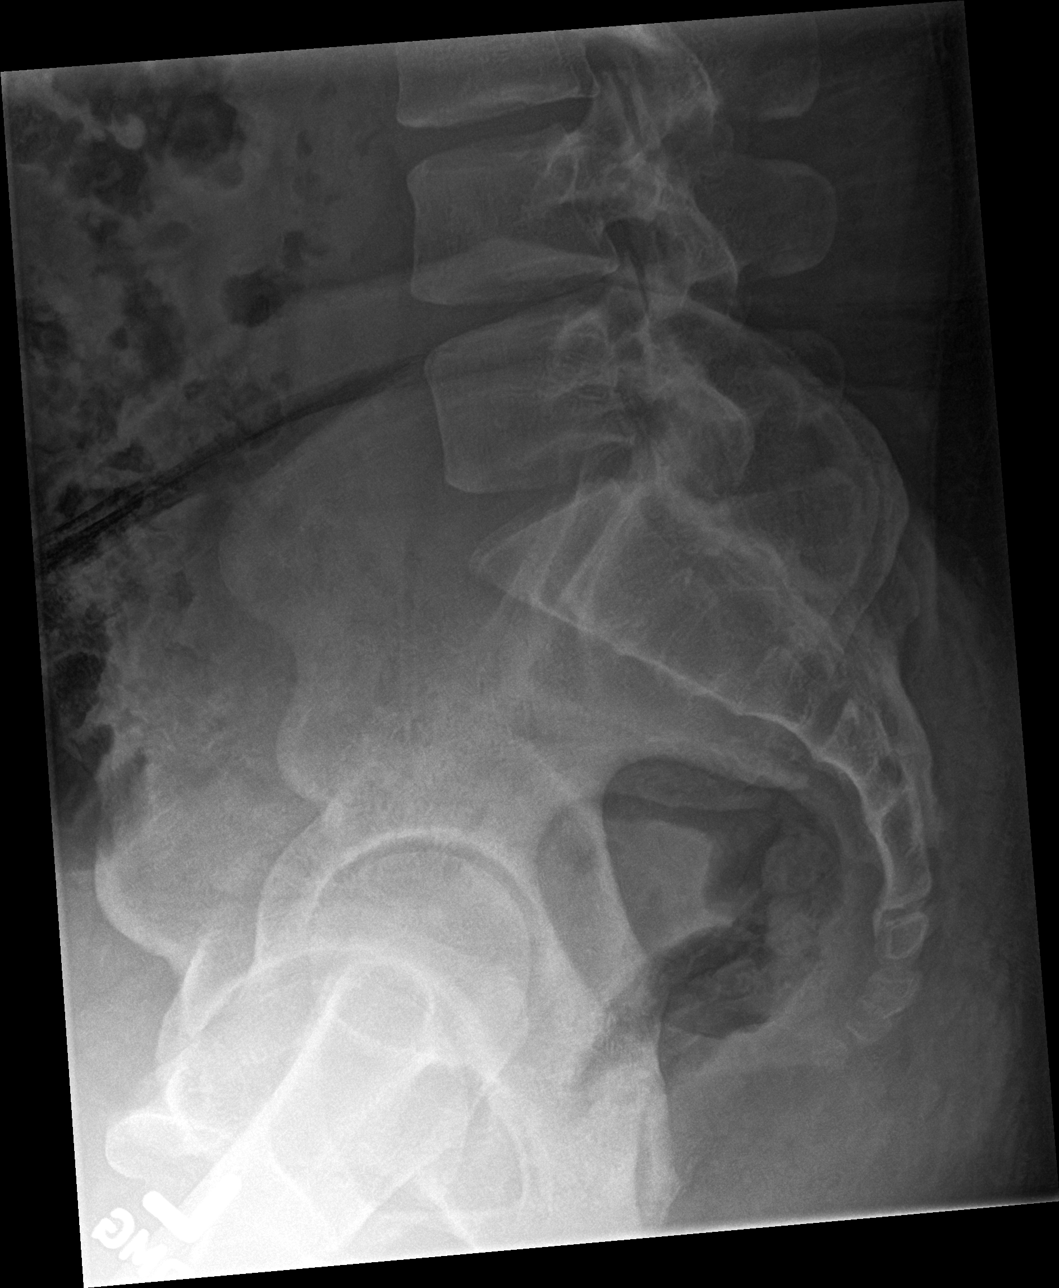

[l-spine obl (1 of 2)]
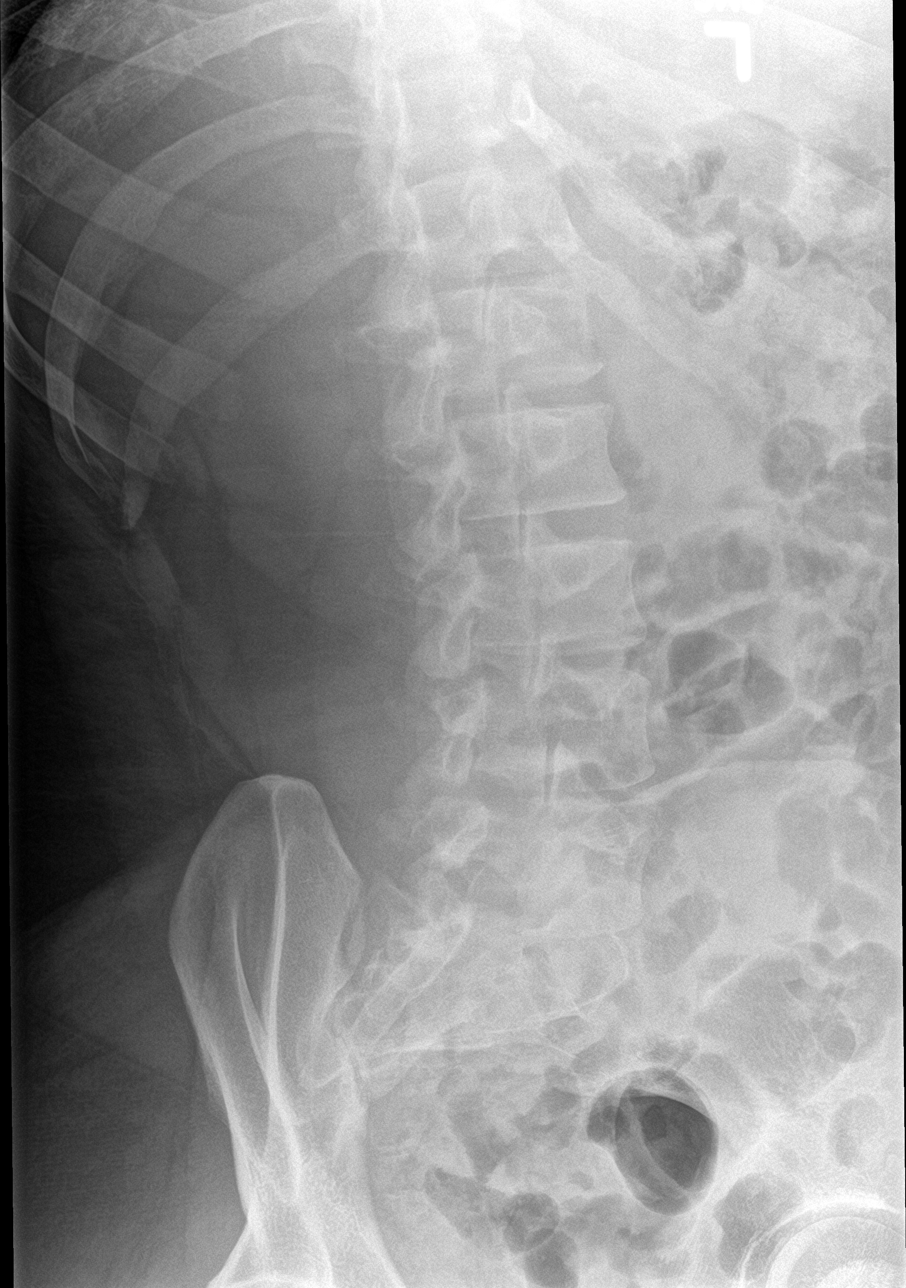

[l-spine obl (2 of 2)]
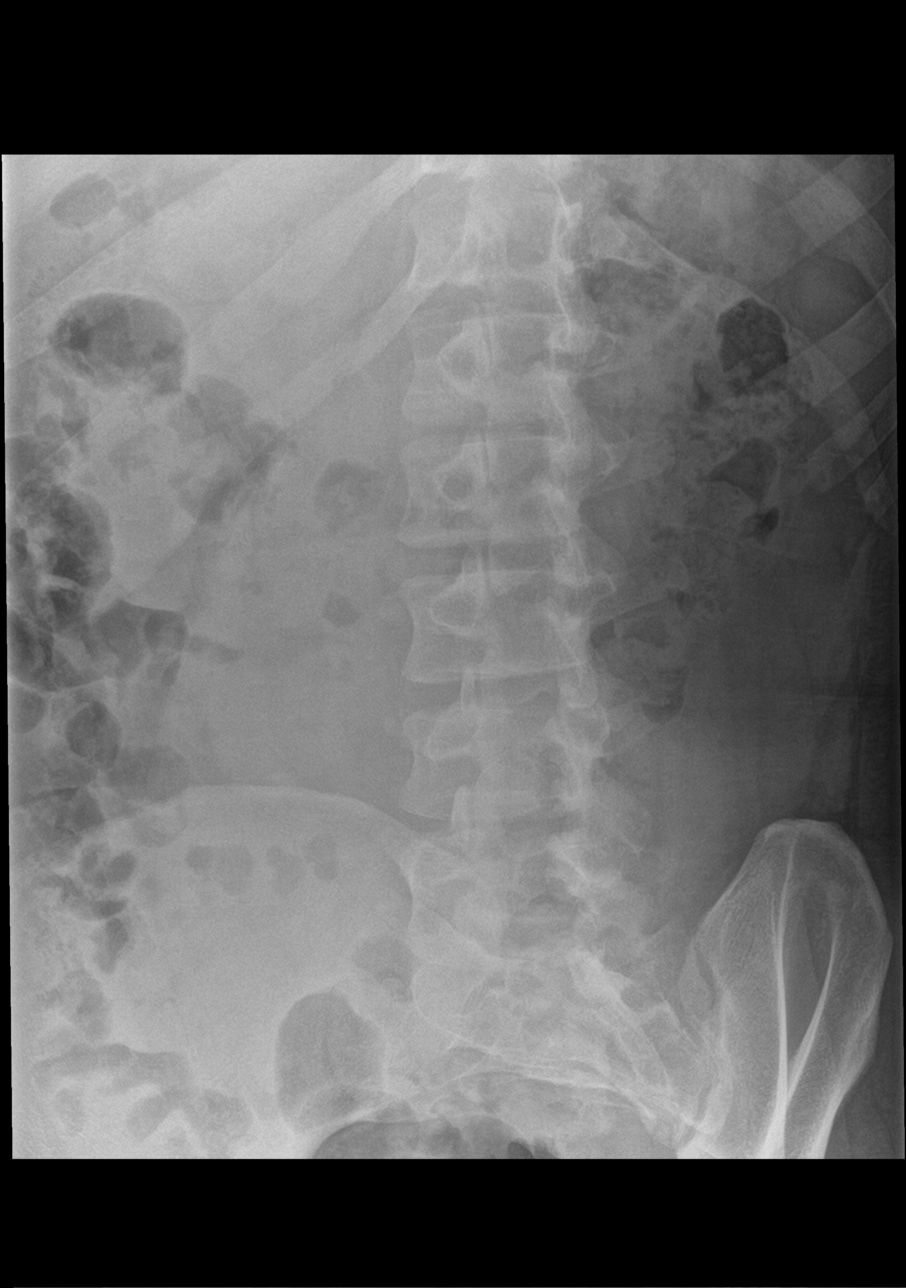

[5 of 5 positions shown; findings below may reference images not displayed]

FINDINGS: There is no evidence of lumbar spine fracture. Alignment is normal.
Intervertebral disc spaces are maintained.
IMPRESSION: Negative.

## 2021-01-23 IMAGING — DX DG HIP (WITH OR WITHOUT PELVIS) 2-3V*L*
3 series · 3 of 3 positions shown · non-contrast
Comparison: None.

CLINICAL DATA: Pain

EXAM:
DG HIP (WITH OR WITHOUT PELVIS) 2-3V LEFT

[pelvis ap]
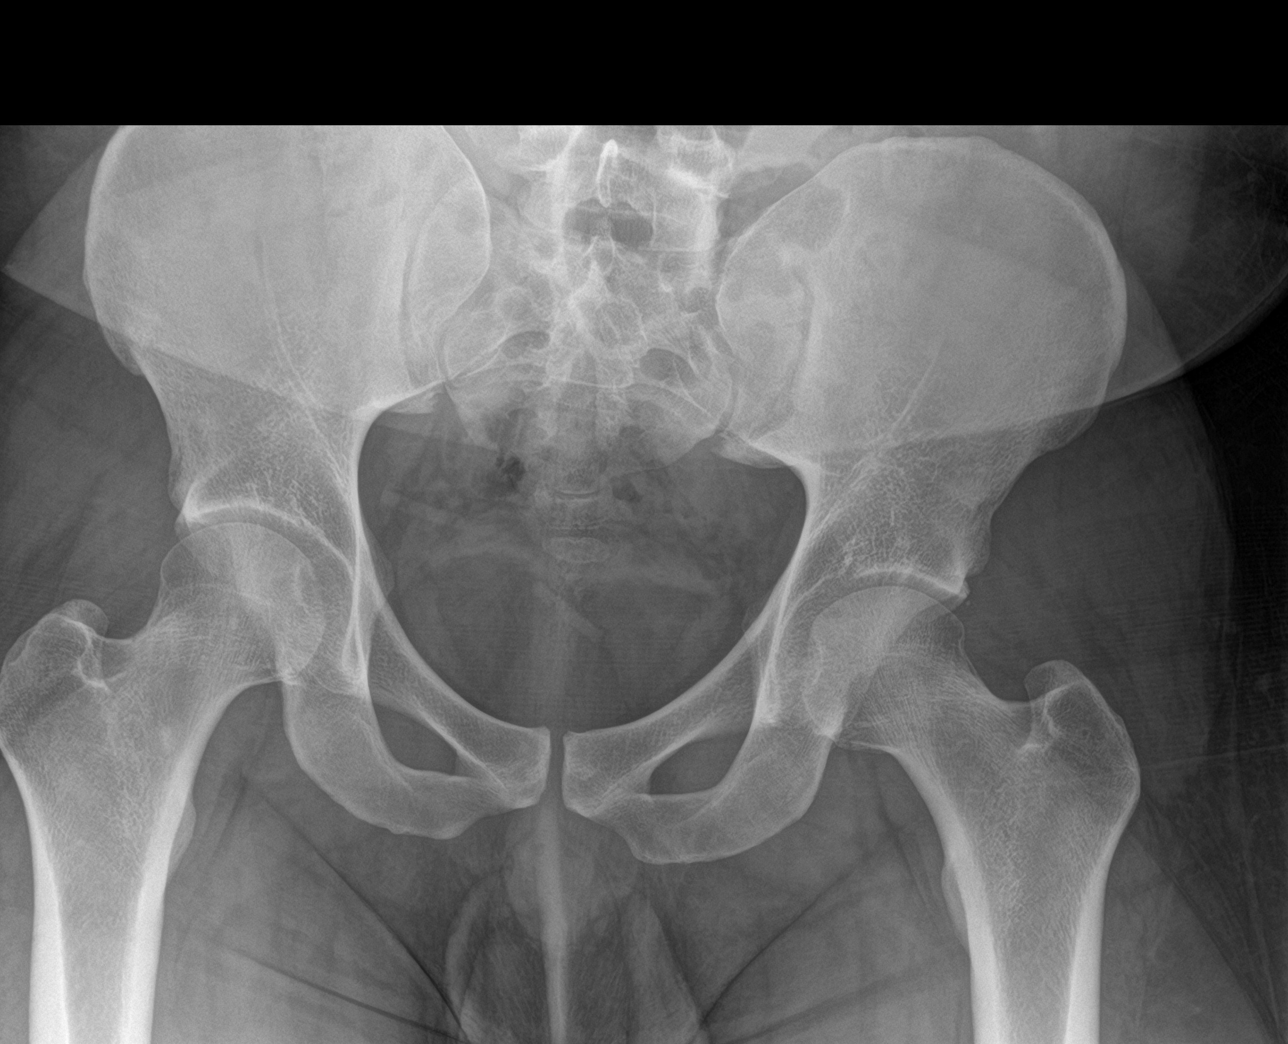

[hip ap]
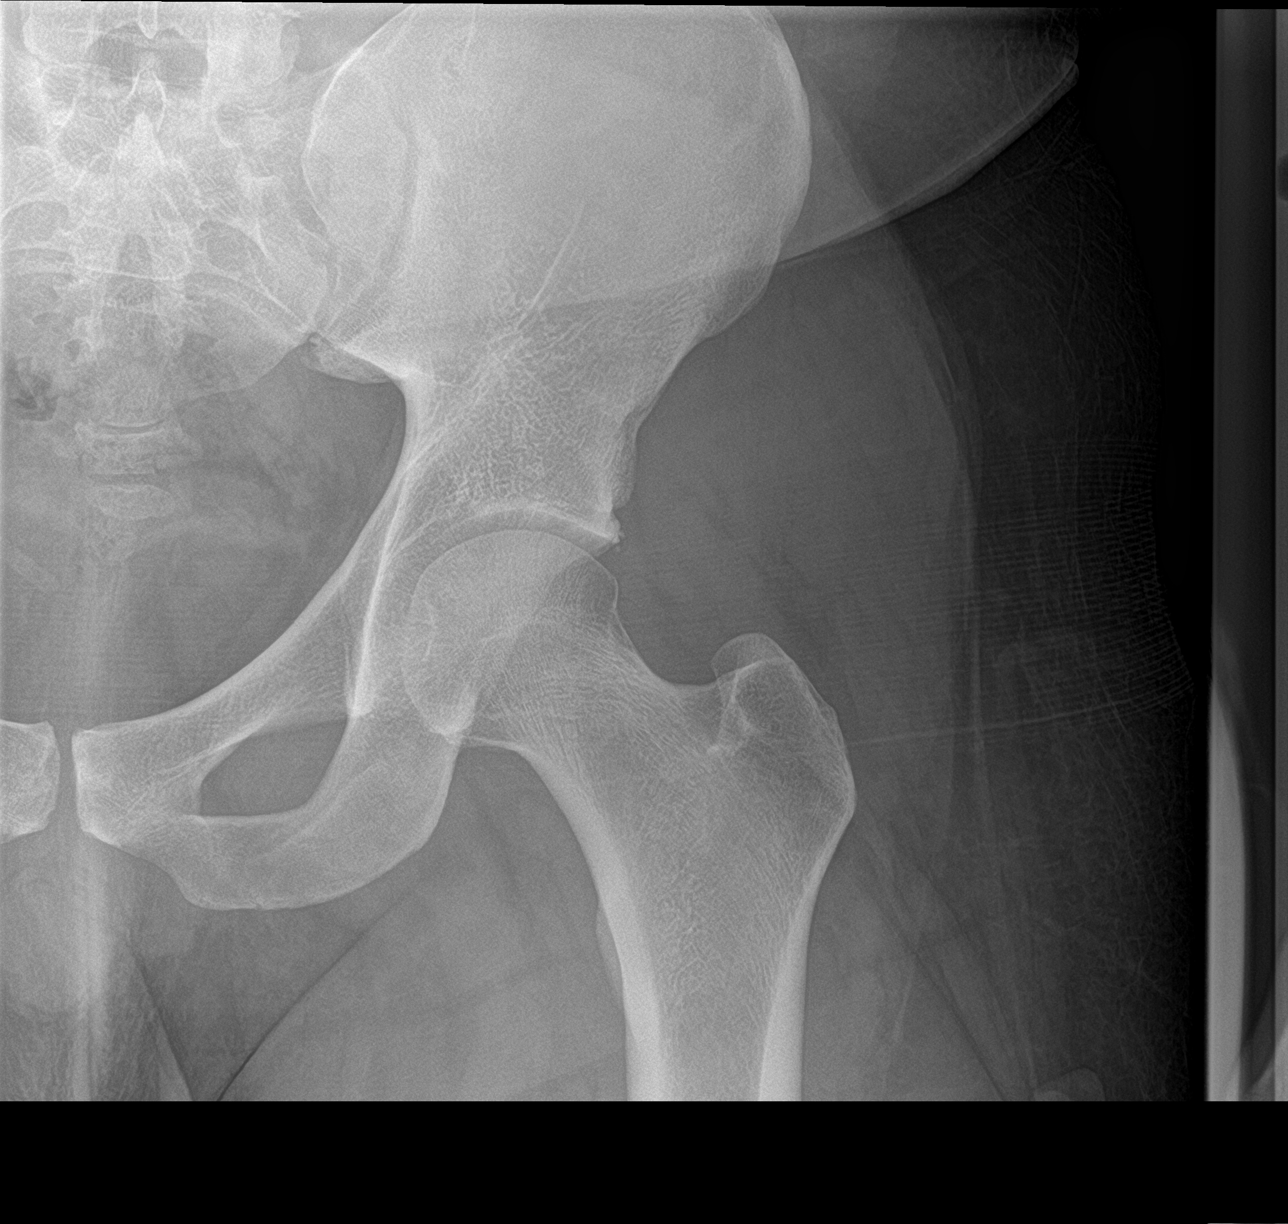

[hip lat]
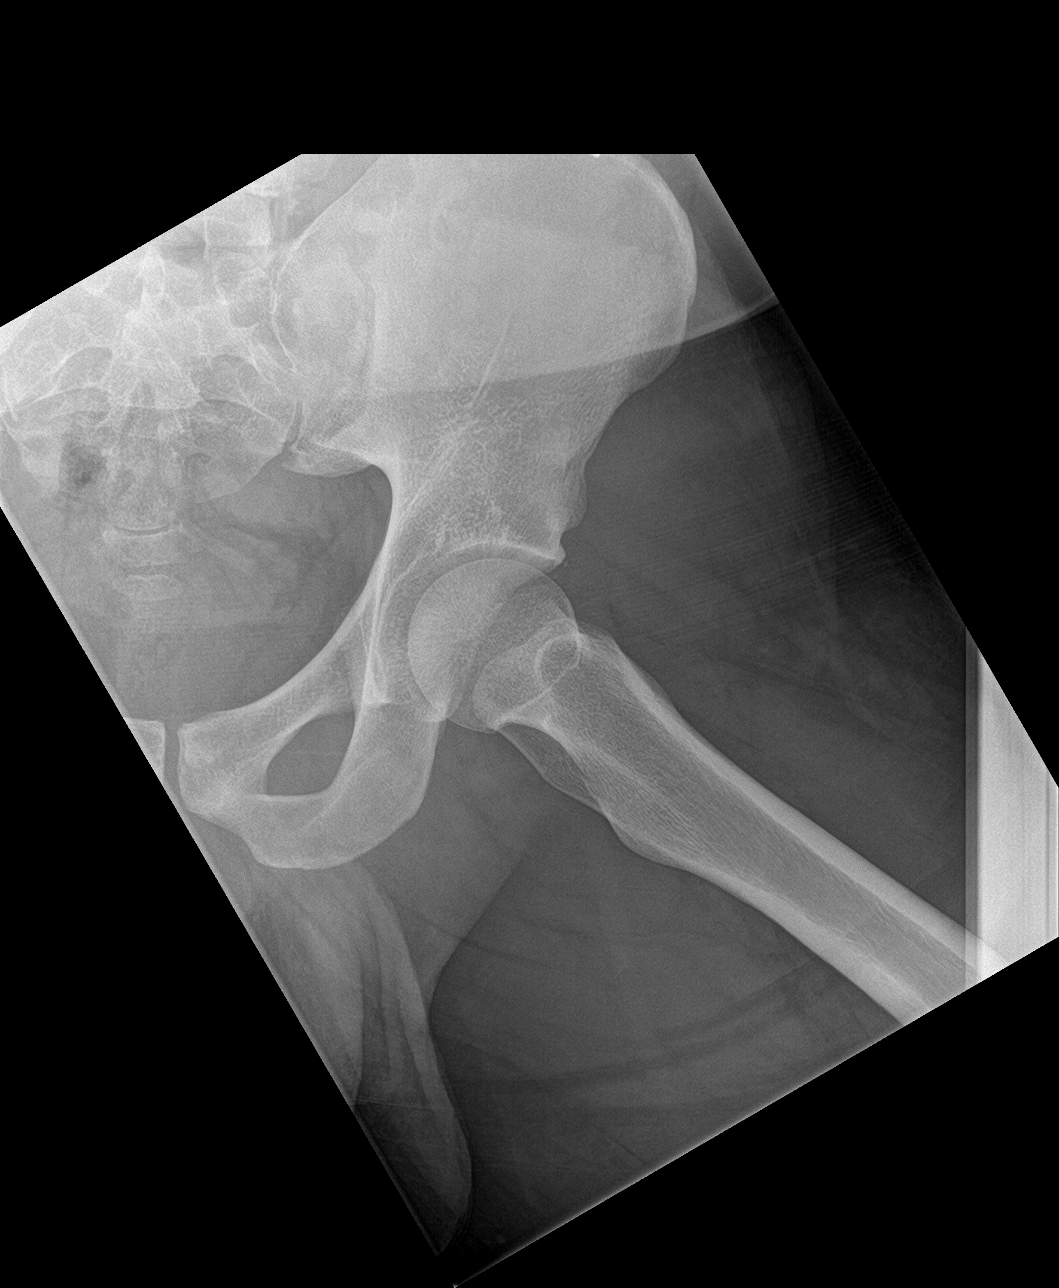

[3 of 3 positions shown; findings below may reference images not displayed]

FINDINGS: There is no evidence of hip fracture or dislocation. There is no
evidence of arthropathy or other focal bone abnormality.
IMPRESSION: Negative.

## 2021-01-23 MED ORDER — METHOCARBAMOL 500 MG PO TABS: 500.0000 mg | ORAL_TABLET | Freq: Two times a day (BID) | ORAL | 0 refills | Status: DC

## 2021-01-23 MED ORDER — METHOCARBAMOL 500 MG PO TABS
750.0000 mg | ORAL_TABLET | Freq: Once | ORAL | Status: AC
  Administered 2021-01-23: 750 mg via ORAL
  Filled 2021-01-23: qty 2

## 2021-01-23 MED ORDER — ACETAMINOPHEN 325 MG PO TABS
650.0000 mg | ORAL_TABLET | Freq: Once | ORAL | Status: AC
  Administered 2021-01-23: 650 mg via ORAL
  Filled 2021-01-23: qty 2
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
## 2021-04-22 ENCOUNTER — Encounter: Payer: Self-pay | Admitting: Family

## 2021-04-22 ENCOUNTER — Other Ambulatory Visit: Payer: Self-pay

## 2021-04-22 ENCOUNTER — Ambulatory Visit (INDEPENDENT_AMBULATORY_CARE_PROVIDER_SITE_OTHER): Payer: Self-pay | Admitting: Family

## 2021-04-22 VITALS — BP 136/92 | HR 68 | Temp 98.1°F | Resp 15 | Ht 71.77 in | Wt 276.6 lb

## 2021-04-22 DIAGNOSIS — Z7689 Persons encountering health services in other specified circumstances: Secondary | ICD-10-CM

## 2021-04-22 DIAGNOSIS — M25552 Pain in left hip: Secondary | ICD-10-CM

## 2021-04-22 DIAGNOSIS — G8929 Other chronic pain: Secondary | ICD-10-CM

## 2021-04-22 MED ORDER — TRAMADOL HCL 50 MG PO TABS: 50.0000 mg | ORAL_TABLET | Freq: Three times a day (TID) | ORAL | 0 refills | Status: AC | PRN

## 2021-04-22 NOTE — Progress Notes (Signed)
Returning patient last visit 08/2019 Pt experiencing sciatic pain on left side since October pain now radiating down to hip causing immobility at times, discomfort when walking and driving-pt would like to MRI if possible  Pt grandmother gave him Tramadol 100mg  which pt stated worked

## 2021-04-30 ENCOUNTER — Other Ambulatory Visit: Payer: Self-pay

## 2021-04-30 ENCOUNTER — Ambulatory Visit (HOSPITAL_COMMUNITY)
Admission: RE | Admit: 2021-04-30 | Discharge: 2021-04-30 | Disposition: A | Discharge status: Home / Self Care | Payer: Medicaid Other | Source: Ambulatory Visit | Attending: Family | Admitting: Family

## 2021-04-30 ENCOUNTER — Other Ambulatory Visit: Payer: Self-pay | Admitting: Family

## 2021-04-30 DIAGNOSIS — G8929 Other chronic pain: Secondary | ICD-10-CM | POA: Insufficient documentation

## 2021-04-30 DIAGNOSIS — N5089 Other specified disorders of the male genital organs: Secondary | ICD-10-CM

## 2021-04-30 DIAGNOSIS — M25552 Pain in left hip: Secondary | ICD-10-CM | POA: Insufficient documentation
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
## 2021-04-30 NOTE — Progress Notes (Signed)
Hip normal.   Referral to Urology for cystic dilation of seminal vesicles. Their office should call patient within 2 weeks with appointment details. If not please notify primary provider.

## 2021-05-01 ENCOUNTER — Encounter: Payer: Self-pay | Admitting: Family

## 2021-05-05 ENCOUNTER — Other Ambulatory Visit: Payer: Self-pay | Admitting: Family

## 2021-05-05 DIAGNOSIS — M79605 Pain in left leg: Secondary | ICD-10-CM

## 2021-05-05 DIAGNOSIS — G8929 Other chronic pain: Secondary | ICD-10-CM

## 2021-05-05 DIAGNOSIS — M545 Low back pain, unspecified: Secondary | ICD-10-CM

## 2021-05-05 NOTE — Progress Notes (Signed)
Patient ID: APOLINAR BERO, male    DOB: 1996-11-08  MRN: [id number]  CC: Annual Physical Exam  Subjective: Aaron Alvarado is a 25 y.o. male who presents for annual physical exam.   His concerns today include: none.   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 30 mL 0   cetirizine (ZYRTEC) 10 MG tablet Take 1 tablet (10 mg total) by mouth daily. 30 tablet 8   fluticasone (FLONASE) 50 MCG/ACT nasal spray Place 1 spray into both nostrils daily. 16 g 0   ibuprofen (ADVIL) 800 MG tablet Take 1 tablet (800 mg total) by mouth every 8 (eight) hours as needed. 30 tablet 0   No current facility-administered medications on file prior to visit.    Allergies  Allergen Reactions   Penicillins Hives and Swelling    Social History   Socioeconomic History   Marital status: Single    Spouse name: Not on file   Number of children: Not on file   Years of education: Not on file   Highest education level: Not on file  Occupational History   Not on file  Tobacco Use   Smoking status: Never   Smokeless tobacco: Never  Vaping Use   Vaping Use: Every day   Substances: Nicotine  Substance and Sexual Activity   Alcohol use: Yes    Alcohol/week: 5.0 standard drinks    Types: 5 Standard drinks or equivalent per week    Comment: weekly   Drug use: Yes    Types: Marijuana     Comment: occassionally   Sexual activity: Yes    Partners: Female, Male    Birth control/protection: Condom    Comment: PrEP  Other Topics Concern   Not on file  Social History Narrative   Not on file   Social Determinants of Health   Financial Resource Strain: Not on file  Food Insecurity: Not on file  Transportation Needs: Not on file  Physical Activity: Not on file  Stress: Not on file  Social Connections: Not on file  Intimate Partner Violence: Not on file    Family History  Problem Relation Age of Onset   Hypertension Mother    Fibromyalgia Mother     Past Surgical History:  Procedure Laterality Date   KNEE ARTHROSCOPY  2011    ROS: Review of Systems Negative except as stated above  PHYSICAL EXAM: BP 134/87 (BP Location: Left Arm, Patient Position: Sitting, Cuff Size: Large)   Pulse 73   Temp 98.1 F (36.7 C)   Resp 16   Ht 5' 11.77" (1.823 m)   Wt 272 lb (123.4 kg)   SpO2 97%   BMI 37.12 kg/m   Physical Exam General appearance - alert, well appearing, and in no distress and oriented to person, place, and time Mental status - alert, oriented to person, place, and time, normal mood, behavior, speech, dress, motor  activity, and thought processes Eyes - pupils equal and reactive, extraocular eye movements intact Ears - bilateral TM's and external ear canals normal Neck - supple, no significant adenopathy Lymphatics - no palpable lymphadenopathy, no hepatosplenomegaly Chest - clear to auscultation, no wheezes, rales or rhonchi, symmetric air entry, no tachypnea, retractions or cyanosis Heart - normal rate, regular rhythm, normal S1, S2, no murmurs, rubs, clicks or gallops Abdomen - soft, nontender, nondistended, no masses or organomegaly GU Male - patient declined examination. Neurological - alert, oriented, normal speech, no focal findings or movement disorder noted, neck supple without rigidity, cranial nerves II through XII intact, funduscopic exam  normal, discs flat and sharp, DTR's normal and symmetric, motor and sensory grossly normal bilaterally, normal muscle tone, no tremors, strength 5/5 Musculoskeletal - no joint tenderness, deformity or swelling Extremities - peripheral pulses normal, no clubbing or cyanosis Skin - normal coloration and turgor, no rashes, no suspicious skin lesions noted   ASSESSMENT AND PLAN: 1. Annual physical exam: - Counseled on 150 minutes of exercise per week as tolerated, healthy eating (including decreased daily intake of saturated fats, cholesterol, added sugars, sodium), STI prevention, and routine healthcare maintenance.  2. Screening for metabolic disorder: - CMP to check kidney function, liver function, and electrolyte balance.  - Comprehensive metabolic panel  3. Screening for deficiency anemia: - CBC to screen for anemia. - CBC  4. Diabetes mellitus screening: - Hemoglobin A1c to screen for pre-diabetes/diabetes. - Hemoglobin A1c  5. Screening cholesterol level: - Lipid panel to screen for high cholesterol.  - Lipid panel  6. Thyroid disorder screen: - TSH to check thyroid function.  - TSH  7. Screening for STD (sexually transmitted disease): - Urine cytology to screen for gonorrhea, chlamydia, and trichomonas.  - Urine cytology ancillary only   Patient was given the opportunity to ask questions.  Patient verbalized understanding of the plan and was able to repeat key elements of the plan. Patient was given clear instructions to go to Emergency Department or return to medical center if symptoms don't improve, worsen, or new problems develop.The patient verbalized understanding.   Orders Placed This Encounter  Procedures   CBC   Comprehensive metabolic panel   Lipid panel   TSH   Hemoglobin A1c    Follow-up with primary provider as scheduled.   Rema Fendt, NP

## 2021-05-06 ENCOUNTER — Other Ambulatory Visit (HOSPITAL_COMMUNITY)
Admission: RE | Admit: 2021-05-06 | Discharge: 2021-05-06 | Disposition: A | Discharge status: Home / Self Care | Payer: Medicaid Other | Source: Ambulatory Visit | Attending: Family | Admitting: Family

## 2021-05-06 ENCOUNTER — Other Ambulatory Visit: Payer: Self-pay

## 2021-05-06 ENCOUNTER — Encounter: Payer: Self-pay | Admitting: Family

## 2021-05-06 ENCOUNTER — Ambulatory Visit (INDEPENDENT_AMBULATORY_CARE_PROVIDER_SITE_OTHER): Payer: Self-pay | Admitting: Family

## 2021-05-06 VITALS — BP 134/87 | HR 73 | Temp 98.1°F | Resp 16 | Ht 71.77 in | Wt 272.0 lb

## 2021-05-06 DIAGNOSIS — Z1329 Encounter for screening for other suspected endocrine disorder: Secondary | ICD-10-CM

## 2021-05-06 DIAGNOSIS — Z113 Encounter for screening for infections with a predominantly sexual mode of transmission: Secondary | ICD-10-CM | POA: Diagnosis present

## 2021-05-06 DIAGNOSIS — Z131 Encounter for screening for diabetes mellitus: Secondary | ICD-10-CM

## 2021-05-06 DIAGNOSIS — Z130 Encounter for screening for diseases of the blood and blood-forming organs and certain disorders involving the immune mechanism: Secondary | ICD-10-CM

## 2021-05-06 DIAGNOSIS — Z0000 Encounter for general adult medical examination without abnormal findings: Secondary | ICD-10-CM

## 2021-05-06 DIAGNOSIS — Z13220 Encounter for screening for lipoid disorders: Secondary | ICD-10-CM

## 2021-05-06 DIAGNOSIS — Z111 Encounter for screening for respiratory tuberculosis: Secondary | ICD-10-CM

## 2021-05-06 DIAGNOSIS — Z13228 Encounter for screening for other metabolic disorders: Secondary | ICD-10-CM

## 2021-05-06 NOTE — Addendum Note (Signed)
Addended by: Margorie John on: 05/06/2021 05:16 PM   Modules accepted: Orders

## 2021-05-06 NOTE — Patient Instructions (Signed)
Preventive Care 21-24 Years Old, Male Preventive care refers to lifestyle choices and visits with your health care provider that can promote health and wellness. This includes: A yearly physical exam. This is also called an annual wellness visit. Regular dental and eye exams. Immunizations. Screening for certain conditions. Healthy lifestyle choices, such as: Eating a healthy diet. Getting regular exercise. Not using drugs or products that contain nicotine and tobacco. Limiting alcohol use. What can I expect for my preventive care visit? Physical exam Your health care provider may check your: Height and weight. These may be used to calculate your BMI (body mass index). BMI is a measurement that tells if you are at a healthy weight. Heart rate and blood pressure. Body temperature. Skin for abnormal spots. Counseling Your health care provider may ask you questions about your: Past medical problems. Family's medical history. Alcohol, tobacco, and drug use. Emotional well-being. Home life and relationship well-being. Sexual activity. Diet, exercise, and sleep habits. Work and work environment. Access to firearms. What immunizations do I need?  Vaccines are usually given at various ages, according to a schedule. Your health care provider will recommend vaccines for you based on your age, medicalhistory, and lifestyle or other factors, such as travel or where you work. What tests do I need? Blood tests Lipid and cholesterol levels. These may be checked every 5 years starting at age 20. Hepatitis C test. Hepatitis B test. Screening  Diabetes screening. This is done by checking your blood sugar (glucose) after you have not eaten for a while (fasting). Genital exam to check for testicular cancer or hernias. STD (sexually transmitted disease) testing, if you are at risk. Talk with your health care provider about your test results, treatment options,and if necessary, the need for more  tests. Follow these instructions at home: Eating and drinking  Eat a healthy diet that includes fresh fruits and vegetables, whole grains, lean protein, and low-fat dairy products. Drink enough fluid to keep your urine pale yellow. Take vitamin and mineral supplements as recommended by your health care provider. Do not drink alcohol if your health care provider tells you not to drink. If you drink alcohol: Limit how much you have to 0-2 drinks a day. Be aware of how much alcohol is in your drink. In the [place] one drink equals one 12 oz bottle of beer (355 mL), one 5 oz glass of wine (148 mL), or one 1 oz glass of hard liquor (44 mL).  Lifestyle Take daily care of your teeth and gums. Brush your teeth every morning and night with fluoride toothpaste. Floss one time each day. Stay active. Exercise for at least 30 minutes 5 or more days each week. Do not use any products that contain nicotine or tobacco, such as cigarettes, e-cigarettes, and chewing tobacco. If you need help quitting, ask your health care provider. Do not use drugs. If you are sexually active, practice safe sex. Use a condom or other form of protection to prevent STIs (sexually transmitted infections). Find healthy ways to cope with stress, such as: Meditation, yoga, or listening to music. Journaling. Talking to a trusted person. Spending time with friends and family. Safety Always wear your seat belt while driving or riding in a vehicle. Do not drive: If you have been drinking alcohol. Do not ride with someone who has been drinking. When you are tired or distracted. While texting. Wear a helmet and other protective equipment during sports activities. If you have firearms in your house, make sure   you follow all gun safety procedures. Seek help if you have been physically or sexually abused. What's next? Go to your health care provider once a year for an annual wellness visit. Ask your health care provider how often  you should have your eyes and teeth checked. Stay up to date on all vaccines. This information is not intended to replace advice given to you by your health care provider. Make sure you discuss any questions you have with your healthcare provider. Document Revised: 07/18/2019 Document Reviewed: 10/26/2018 Elsevier Patient Education  2022 [employer]vier Inc.  

## 2021-05-06 NOTE — Progress Notes (Addendum)
Pt presents for annual physical examination  Tuberculin skin test applied to left ventral forearm. Explained to patient to return within 48-72 hours for reading and paperwork. Patient verbalized understanding.

## 2021-05-07 LAB — LIPID PANEL
Chol/HDL Ratio: 5.1 ratio — ABNORMAL HIGH (ref 0.0–5.0)
Cholesterol, Total: 182 mg/dL (ref 100–199)
HDL: 36 mg/dL — ABNORMAL LOW (ref >39)
LDL Chol Calc (NIH): 127 mg/dL — ABNORMAL HIGH (ref 0–99)
Triglycerides: 101 mg/dL (ref 0–149)
VLDL Cholesterol Cal: 19 mg/dL (ref 5–40)

## 2021-05-07 LAB — COMPREHENSIVE METABOLIC PANEL
ALT: 21 IU/L (ref 0–44)
AST: 19 IU/L (ref 0–40)
Albumin/Globulin Ratio: 1.7 (ref 1.2–2.2)
Albumin: 4.7 g/dL (ref 4.1–5.2)
Alkaline Phosphatase: 63 IU/L (ref 44–121)
BUN/Creatinine Ratio: 9 (ref 9–20)
BUN: 9 mg/dL (ref 6–20)
Bilirubin Total: 0.9 mg/dL (ref 0.0–1.2)
CO2: 25 mmol/L (ref 20–29)
Calcium: 9.9 mg/dL (ref 8.7–10.2)
Chloride: 102 mmol/L (ref 96–106)
Creatinine, Ser: 0.96 mg/dL (ref 0.76–1.27)
Globulin, Total: 2.8 g/dL (ref 1.5–4.5)
Glucose: 75 mg/dL (ref 65–99)
Potassium: 4.1 mmol/L (ref 3.5–5.2)
Sodium: 140 mmol/L (ref 134–144)
Total Protein: 7.5 g/dL (ref 6.0–8.5)
eGFR: 113 mL/min/{1.73_m2} (ref >59)

## 2021-05-07 LAB — URINE CYTOLOGY ANCILLARY ONLY
Bacterial Vaginitis-Urine: NEGATIVE
Candida Urine: NEGATIVE
Chlamydia: NEGATIVE
Comment: NEGATIVE
Comment: NEGATIVE
Comment: NORMAL
Neisseria Gonorrhea: NEGATIVE
Trichomonas: NEGATIVE

## 2021-05-07 LAB — CBC
Hematocrit: 45.5 % (ref 37.5–51.0)
Hemoglobin: 15.1 g/dL (ref 13.0–17.7)
MCH: 28.1 pg (ref 26.6–33.0)
MCHC: 33.2 g/dL (ref 31.5–35.7)
MCV: 85 fL (ref 79–97)
Platelets: 269 10*3/uL (ref 150–450)
RBC: 5.38 x10E6/uL (ref 4.14–5.80)
RDW: 12.6 % (ref 11.6–15.4)
WBC: 6.1 10*3/uL (ref 3.4–10.8)

## 2021-05-07 LAB — HEMOGLOBIN A1C
Est. average glucose Bld gHb Est-mCnc: 117 mg/dL
Hgb A1c MFr Bld: 5.7 % — ABNORMAL HIGH (ref 4.8–5.6)

## 2021-05-07 LAB — TSH: TSH: 0.862 u[IU]/mL (ref 0.450–4.500)

## 2021-05-07 NOTE — Progress Notes (Signed)
Chlamydia, Gonorrhea, Trichomonas, and Yeast Infection negative.

## 2021-05-07 NOTE — Progress Notes (Signed)
Kidney function normal.   Liver function normal.   Thyroid function normal.   No anemia.   Cholesterol higher than expected. High cholesterol may increase risk of heart attack and/or stroke. Consider eating more fruits, vegetables, and lean baked meats such as chicken or fish. Moderate intensity exercise at least 150 minutes as tolerated per week may help as well. However your risk of heart attack/stroke in ten years is average risk so does not need to start a medication at the moment. Encouraged to recheck in 3 to 6 months.   Hemoglobin A1c is consistent with pre-diabetes. Practice healthy eating habits of fresh fruit and vegetables, lean baked meats such as chicken, fish, and [place]; limit breads, rice, pastas, and desserts; practice regular aerobic exercise (at least 150 minutes a week as tolerated). No medication needed at the moment. Encouraged to recheck in 6 months.

## 2021-05-11 LAB — TB SKIN TEST
Induration: 0 mm
TB Skin Test: NEGATIVE

## 2021-05-11 NOTE — Progress Notes (Signed)
TB skin negative.

## 2021-05-11 NOTE — Progress Notes (Signed)
PPD skin test reading- NEGATIVE

## 2021-05-13 ENCOUNTER — Telehealth: Payer: Self-pay | Admitting: Family

## 2021-05-13 NOTE — Telephone Encounter (Signed)
Pt called in stating for the past 3 days he has been having extreme leg pain. He is asking if tramadol can be called in to the [facility] on [address] in [place]. Pt also asking if you can go ahead and schedule the MRI for him

## 2021-05-14 ENCOUNTER — Emergency Department (HOSPITAL_BASED_OUTPATIENT_CLINIC_OR_DEPARTMENT_OTHER)
Admission: EM | Admit: 2021-05-14 | Discharge: 2021-05-14 | Disposition: A | Discharge status: Home / Self Care | Payer: Medicaid Other | Attending: Emergency Medicine | Admitting: Emergency Medicine

## 2021-05-14 ENCOUNTER — Encounter (HOSPITAL_BASED_OUTPATIENT_CLINIC_OR_DEPARTMENT_OTHER): Payer: Self-pay

## 2021-05-14 ENCOUNTER — Other Ambulatory Visit: Payer: Self-pay

## 2021-05-14 DIAGNOSIS — M5432 Sciatica, left side: Secondary | ICD-10-CM

## 2021-05-14 DIAGNOSIS — J45909 Unspecified asthma, uncomplicated: Secondary | ICD-10-CM | POA: Insufficient documentation

## 2021-05-14 DIAGNOSIS — M5442 Lumbago with sciatica, left side: Secondary | ICD-10-CM | POA: Insufficient documentation

## 2021-05-14 DIAGNOSIS — M79672 Pain in left foot: Secondary | ICD-10-CM | POA: Insufficient documentation

## 2021-05-14 DIAGNOSIS — Z7951 Long term (current) use of inhaled steroids: Secondary | ICD-10-CM | POA: Insufficient documentation

## 2021-05-14 DIAGNOSIS — I10 Essential (primary) hypertension: Secondary | ICD-10-CM | POA: Insufficient documentation

## 2021-05-14 DIAGNOSIS — R202 Paresthesia of skin: Secondary | ICD-10-CM | POA: Insufficient documentation

## 2021-05-14 MED ORDER — NAPROXEN 500 MG PO TABS: 500.0000 mg | ORAL_TABLET | Freq: Two times a day (BID) | ORAL | 0 refills | Status: DC

## 2021-05-14 MED ORDER — DEXAMETHASONE SODIUM PHOSPHATE 10 MG/ML IJ SOLN
8.0000 mg | Freq: Once | INTRAMUSCULAR | Status: AC
  Administered 2021-05-14: 8 mg via INTRAMUSCULAR
  Filled 2021-05-14: qty 1

## 2021-05-14 MED ORDER — KETOROLAC TROMETHAMINE 30 MG/ML IJ SOLN
30.0000 mg | Freq: Once | INTRAMUSCULAR | Status: AC
  Administered 2021-05-14: 30 mg via INTRAMUSCULAR
  Filled 2021-05-14: qty 1

## 2021-05-14 NOTE — Telephone Encounter (Signed)
MRI's have been scheduled for pt 7/13
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
## 2021-05-14 NOTE — ED Triage Notes (Signed)
Pt c/o left LE pain-states he was seen by PCP last week and has MRI 7/13-NAD-to triage in w/c

## 2021-05-14 NOTE — Discharge Instructions (Addendum)
  You were evaluated in the Emergency Department and after careful evaluation, we did not find any emergent condition requiring admission or further testing in the hospital.   Your exam/testing today was overall reassuring.  Symptoms seem to be due to sciatica.  I prescribed you naproxen that I want you to take for the next couple of days, the shot of steroids that I gave you were last in your system for a long time Sital needed prescription for steroids.  Please follow-up with the orthopedic doctor, their information is provided above.  Please follow-up with your PCP and keep your MRI appointment if pain does not resolve.  Please use the attached instructions for sports. Please return to the Emergency Department if you experience any worsening of your condition.  Thank you for allowing [place] to be a part of your care. Please speak to your pharmacist about any new medications prescribed today in regards to side effects or interactions with other medications.

## 2021-05-15 ENCOUNTER — Ambulatory Visit: Payer: Self-pay | Attending: Family

## 2021-05-19 ENCOUNTER — Other Ambulatory Visit: Payer: Self-pay

## 2021-05-19 ENCOUNTER — Ambulatory Visit: Payer: Self-pay | Admitting: Physician Assistant

## 2021-05-19 ENCOUNTER — Encounter: Payer: Self-pay | Admitting: Physician Assistant

## 2021-05-19 VITALS — BP 145/90 | HR 72 | Temp 98.3°F | Resp 18 | Ht 71.5 in | Wt 274.0 lb

## 2021-05-19 DIAGNOSIS — M5442 Lumbago with sciatica, left side: Secondary | ICD-10-CM

## 2021-05-19 DIAGNOSIS — Z6837 Body mass index (BMI) 37.0-37.9, adult: Secondary | ICD-10-CM

## 2021-05-19 DIAGNOSIS — E6609 Other obesity due to excess calories: Secondary | ICD-10-CM

## 2021-05-19 MED ORDER — MELOXICAM 15 MG PO TABS: 15.0000 mg | ORAL_TABLET | Freq: Every day | ORAL | 0 refills | Status: DC

## 2021-05-19 MED ORDER — CYCLOBENZAPRINE HCL 10 MG PO TABS: 10.0000 mg | ORAL_TABLET | Freq: Three times a day (TID) | ORAL | 0 refills | Status: DC | PRN

## 2021-05-19 NOTE — Progress Notes (Signed)
Patient has taken medication today and patient has eaten today. Patient shares that naprosyn and tylenol is not helping the nerve pain in his left leg. Patients mother reports hard, hot to touch area on the back of left calf.

## 2021-05-19 NOTE — Patient Instructions (Signed)
For your pain, you will take Mobic 15 mg once a day, and Flexeril 3 times a day as needed.  Make sure that you are doing gentle stretching, using ice, and drinking lots of water.  Please let us know if there is anything else we can do for you.  Roney Jaffe, PA-C Physician Assistant [facility] [email or web address]   Sciatica  Sciatica is pain, numbness, weakness, or tingling along the path of the sciatic nerve. The sciatic nerve starts in the lower back and runs down the back of each leg. The nerve controls the muscles in the lower leg and in the back of the knee. It also provides feeling (sensation) to the back of the thigh, the lower leg, and the sole of the foot. Sciatica is a symptom of another medical condition that pinches or puts pressure on thesciatic nerve. Sciatica most often only affects one side of the body. Sciatica usually goes away on its own or with treatment. In some cases, sciatica may come back (recur). What are the causes? This condition is caused by pressure on the sciatic nerve or pinching of the nerve. This may be the result of: A disk in between the bones of the spine bulging out too far (herniated disk). Age-related changes in the spinal disks. A pain disorder that affects a muscle in the buttock. Extra bone growth near the sciatic nerve. A break (fracture) of the pelvis. Pregnancy. Tumor. This is rare. What increases the risk? The following factors may make you more likely to develop this condition: Playing sports that place pressure or stress on the spine. Having poor strength and flexibility. A history of back injury or surgery. Sitting for long periods of time. Doing activities that involve repetitive bending or lifting. Obesity. What are the signs or symptoms? Symptoms can vary from mild to very severe, and they may include: Any of these problems in the lower back, leg, hip, or buttock: Mild  tingling, numbness, or dull aches. Burning sensations. Sharp pains. Numbness in the back of the calf or the sole of the foot. Leg weakness. Severe back pain that makes movement difficult. Symptoms may get worse when you cough, sneeze, or laugh, or when you sit orstand for long periods of time. How is this diagnosed? This condition may be diagnosed based on: Your symptoms and medical history. A physical exam. Blood tests. Imaging tests, such as: X-rays. MRI. CT scan. How is this treated? In many cases, this condition improves on its own without treatment. However, treatment may include: Reducing or modifying physical activity. Exercising and stretching. Icing and applying heat to the affected area. Medicines that help to: Relieve pain and swelling. Relax your muscles. Injections of medicines that help to relieve pain, irritation, and inflammation around the sciatic nerve (steroids). Surgery. Follow these instructions at home: Medicines Take over-the-counter and prescription medicines only as told by your health care provider. Ask your health care provider if the medicine prescribed to you: Requires you to avoid driving or using heavy machinery. Can cause constipation. You may need to take these actions to prevent or treat constipation: Drink enough fluid to keep your urine pale yellow. Take over-the-counter or prescription medicines. Eat foods that are high in fiber, such as beans, whole grains, and fresh fruits and vegetables. Limit foods that are high in fat and processed sugars, such as fried or sweet foods. Managing pain     If directed, put ice on the affected area. Put ice in a plastic bag.  Place a towel between your skin and the bag. Leave the ice on for 20 minutes, 2-3 times a day. If directed, apply heat to the affected area. Use the heat source that your health care provider recommends, such as a moist heat pack or a heating pad. Place a towel between your skin  and the heat source. Leave the heat on for 20-30 minutes. Remove the heat if your skin turns bright red. This is especially important if you are unable to feel pain, heat, or cold. You may have a greater risk of getting burned. Activity  Return to your normal activities as told by your health care provider. Ask your health care provider what activities are safe for you. Avoid activities that make your symptoms worse. Take brief periods of rest throughout the day. When you rest for longer periods, mix in some mild activity or stretching between periods of rest. This will help to prevent stiffness and pain. Avoid sitting for long periods of time without moving. Get up and move around at least one time each hour. Exercise and stretch regularly, as told by your health care provider. Do not lift anything that is heavier than 10 lb (4.5 kg) while you have symptoms of sciatica. When you do not have symptoms, you should still avoid heavy lifting, especially repetitive heavy lifting. When you lift objects, always use proper lifting technique, which includes: Bending your knees. Keeping the load close to your body. Avoiding twisting.  General instructions Maintain a healthy weight. Excess weight puts extra stress on your back. Wear supportive, comfortable shoes. Avoid wearing high heels. Avoid sleeping on a mattress that is too soft or too hard. A mattress that is firm enough to support your back when you sleep may help to reduce your pain. Keep all follow-up visits as told by your health care provider. This is important. Contact a health care provider if: You have pain that: Wakes you up when you are sleeping. Gets worse when you lie down. Is worse than you have experienced in the past. Lasts longer than 4 weeks. You have an unexplained weight loss. Get help right away if: You are not able to control when you urinate or have bowel movements (incontinence). You have: Weakness in your lower  back, pelvis, buttocks, or legs that gets worse. Redness or swelling of your back. A burning sensation when you urinate. Summary Sciatica is pain, numbness, weakness, or tingling along the path of the sciatic nerve. This condition is caused by pressure on the sciatic nerve or pinching of the nerve. Sciatica can cause pain, numbness, or tingling in the lower back, legs, hips, and buttocks. Treatment often includes rest, exercise, medicines, and applying ice or heat. This information is not intended to replace advice given to you by your health care provider. Make sure you discuss any questions you have with your healthcare provider. Document Revised: 11/20/2018 Document Reviewed: 11/20/2018 Elsevier Patient Education  2022 [employer].

## 2021-05-19 NOTE — Progress Notes (Signed)
Established Patient Office Visit  Subjective:  Patient ID: Aaron Alvarado, male    DOB: [birth year]  Age: 25 y.o. MRN: [id number]  CC:  Chief Complaint  Patient presents with   Leg Pain    Left    HPI Aaron Alvarado reports that he was seen in the emergency department on May 14, 2021:   Aaron Alvarado is a 25 y.o. male with no pertinent past medical history that presents the emerge department today for left extremity pain.  Patient is distally neurovascularly intact, positive leg raise.  Normal neuro exam. Concerns for sciatica at this time.  Patient without any back pain, no red flag back symptoms.  Low concerns for DVT, will DVT score 0, shared decision making about ultrasound at this time, both decided this not necessary.  No concerns for cellulitis or infection.  We will treat for sciatica at this time and reevaluate.   After Toradol and Decadron, patient is able to ambulate, states that he feels much better, ready for discharge.  Patient will be discharged at this time, will follow up with sports medicine.  He will also follow-up with his PCP.  Symptomatic treatment discussed, patient agreeable with plan.  States that he feels much better.   Doubt need for further emergent work up at this time. I explained the diagnosis and have given explicit precautions to return to the ER including for any other new or worsening symptoms. The patient understands and accepts the medical plan as it's been dictated and I have answered their questions. Discharge instructions concerning home care and prescriptions have been given. The patient is STABLE and is discharged to home in good condition.  States today that the pain did not improve after having the steroid injection and pain injection.  Reports that he has tried the naproxen, stretching, and ice without relief.  Mother is present and is concerned that his pain continues.  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  States that his  pain is making it difficult for him to work.    Past Medical History:  Diagnosis Date   Asthma    Blount's disease    Environmental allergies    Hypertension    PTSD (post-traumatic stress disorder)     Past Surgical History:  Procedure Laterality Date   KNEE ARTHROSCOPY  2011    Family History  Problem Relation Age of Onset   Hypertension Mother    Fibromyalgia Mother     Social History   Socioeconomic History   Marital status: Single    Spouse name: Not on file   Number of children: Not on file   Years of education: Not on file   Highest education level: Not on file  Occupational History   Not on file  Tobacco Use   Smoking status: Never   Smokeless tobacco: Never  Vaping Use   Vaping Use: Every day   Substances: Nicotine  Substance and Sexual Activity   Alcohol use: Yes    Alcohol/week: 5.0 standard drinks    Types: 5 Standard drinks or equivalent per week    Comment: weekly   Drug use: Yes    Types: Marijuana    Comment: occassionally   Sexual activity: Yes    Partners: Female, Male    Birth control/protection: Condom    Comment: PrEP  Other Topics Concern   Not on file  Social History Narrative   Not on file   Social Determinants  of Health   Financial Resource Strain: Not on file  Food Insecurity: Not on file  Transportation Needs: Not on file  Physical Activity: Not on file  Stress: Not on file  Social Connections: Not on file  Intimate Partner Violence: Not on file    Outpatient Medications Prior to Visit  Medication Sig Dispense Refill   azelastine (ASTELIN) 0.1 % nasal spray Place 2 sprays into both nostrils 2 (two) times daily. 30 mL 0   cetirizine (ZYRTEC) 10 MG tablet Take 1 tablet (10 mg total) by mouth daily. 30 tablet 8   fluticasone (FLONASE) 50 MCG/ACT nasal spray Place 1 spray into both nostrils daily. 16 g 0   ibuprofen (ADVIL) 800 MG tablet Take 1 tablet (800 mg total) by mouth every 8 (eight) hours as needed. 30 tablet 0    naproxen (NAPROSYN) 500 MG tablet Take 1 tablet (500 mg total) by mouth 2 (two) times daily. 30 tablet 0   No facility-administered medications prior to visit.    Allergies  Allergen Reactions   Penicillins Hives and Swelling    ROS Review of Systems  Constitutional: Negative.   HENT: Negative.    Eyes: Negative.   Respiratory:  Negative for shortness of breath.   Cardiovascular:  Negative for chest pain.  Endocrine: Negative.   Genitourinary:  Negative for difficulty urinating, dysuria, penile pain and testicular pain.  Musculoskeletal:  Positive for back pain. Negative for joint swelling.  Skin: Negative.   Allergic/Immunologic: Negative.   Neurological: Negative.   Hematological: Negative.   Psychiatric/Behavioral: Negative.       Objective:    Physical Exam Vitals and nursing note reviewed.  Constitutional:      General: He is not in acute distress.    Appearance: Normal appearance. He is obese.  HENT:     Head: Normocephalic and atraumatic.     Right Ear: External ear normal.     Left Ear: External ear normal.     Nose: Nose normal.     Mouth/Throat:     Mouth: Mucous membranes are moist.     Pharynx: Oropharynx is clear.  Eyes:     Extraocular Movements: Extraocular movements intact.     Conjunctiva/sclera: Conjunctivae normal.     Pupils: Pupils are equal, round, and reactive to light.  Cardiovascular:     Rate and Rhythm: Normal rate and regular rhythm.  Pulmonary:     Effort: Pulmonary effort is normal.     Breath sounds: Normal breath sounds.  Musculoskeletal:     Cervical back: Normal, normal range of motion and neck supple.     Thoracic back: Normal.     Lumbar back: Tenderness present. Decreased range of motion.     Right lower leg: Normal.     Left lower leg: Normal. No swelling. No edema.     Right foot: Normal.     Left foot: Normal. Normal pulse.  Skin:    General: Skin is warm and dry.  Neurological:     General: No focal deficit  present.     Mental Status: He is alert and oriented to person, place, and time.  Psychiatric:        Mood and Affect: Mood normal.        Behavior: Behavior normal.        Thought Content: Thought content normal.        Judgment: Judgment normal.    BP (!) 145/90 (BP Location: Right Arm, Patient Position: Sitting, Cuff  Size: Large)   Pulse 72   Temp 98.3 F (36.8 C) (Oral)   Resp 18   Ht 5' 11.5" (1.816 m)   Wt 274 lb (124.3 kg)   SpO2 97%   BMI 37.68 kg/m  Wt Readings from Last 3 Encounters:  05/19/21 274 lb (124.3 kg)  05/06/21 272 lb (123.4 kg)  04/22/21 276 lb 9.6 oz (125.5 kg)     Health Maintenance Due  Topic Date Due   COVID-19 Vaccine (1) Never done    There are no preventive care reminders to display for this patient.  Lab Results  Component Value Date   TSH 0.862 05/06/2021   Lab Results  Component Value Date   WBC 6.1 05/06/2021   HGB 15.1 05/06/2021   HCT 45.5 05/06/2021   MCV 85 05/06/2021   PLT 269 05/06/2021   Lab Results  Component Value Date   NA 140 05/06/2021   K 4.1 05/06/2021   CO2 25 05/06/2021   GLUCOSE 75 05/06/2021   BUN 9 05/06/2021   CREATININE 0.96 05/06/2021   BILITOT 0.9 05/06/2021   ALKPHOS 63 05/06/2021   AST 19 05/06/2021   ALT 21 05/06/2021   PROT 7.5 05/06/2021   ALBUMIN 4.7 05/06/2021   CALCIUM 9.9 05/06/2021   EGFR 113 05/06/2021   Lab Results  Component Value Date   CHOL 182 05/06/2021   Lab Results  Component Value Date   HDL 36 (L) 05/06/2021   Lab Results  Component Value Date   LDLCALC 127 (H) 05/06/2021   Lab Results  Component Value Date   TRIG 101 05/06/2021   Lab Results  Component Value Date   CHOLHDL 5.1 (H) 05/06/2021   Lab Results  Component Value Date   HGBA1C 5.7 (H) 05/06/2021      Assessment & Plan:   Problem List Items Addressed This Visit       Nervous and Auditory   Acute left-sided low back pain with left-sided sciatica - Primary   Relevant Medications   meloxicam  (MOBIC) 15 MG tablet   cyclobenzaprine (FLEXERIL) 10 MG tablet     Other   Class 2 obesity due to excess calories without serious comorbidity with body mass index (BMI) of 37.0 to 37.9 in adult    Meds ordered this encounter  Medications   meloxicam (MOBIC) 15 MG tablet    Sig: Take 1 tablet (15 mg total) by mouth daily.    Dispense:  30 tablet    Refill:  0    Order Specific Question:   Supervising Provider    Answer:   Asencion Noble E [[id number]]   cyclobenzaprine (FLEXERIL) 10 MG tablet    Sig: Take 1 tablet (10 mg total) by mouth 3 (three) times daily as needed for muscle spasms.    Dispense:  30 tablet    Refill:  0    Order Specific Question:   Supervising Provider    Answer:   Joya Gaskins, PATRICK E [[id number]]   1. Acute left-sided low back pain with left-sided sciatica Patient education given on supportive care, continue stretching.  Trial Mobic and Flexeril.  Red flags given for prompt reevaluation  Very low suspicion for DVT - meloxicam (MOBIC) 15 MG tablet; Take 1 tablet (15 mg total) by mouth daily.  Dispense: 30 tablet; Refill: 0 - cyclobenzaprine (FLEXERIL) 10 MG tablet; Take 1 tablet (10 mg total) by mouth 3 (three) times daily as needed for muscle spasms.  Dispense: 30 tablet; Refill: 0  2. Class 2 obesity due to excess calories without serious comorbidity with body mass index (BMI) of 37.0 to 37.9 in adult   I have reviewed the patient's medical history (PMH, PSH, Social History, Family History, Medications, and allergies) , and have been updated if relevant. I spent 32 minutes reviewing chart and  face to face time with patient.    Follow-up: Return if symptoms worsen or fail to improve.    Loraine Grip Mayers, PA-C

## 2021-05-20 DIAGNOSIS — Z6837 Body mass index (BMI) 37.0-37.9, adult: Secondary | ICD-10-CM | POA: Insufficient documentation

## 2021-05-27 ENCOUNTER — Ambulatory Visit (HOSPITAL_COMMUNITY)
Admission: RE | Admit: 2021-05-27 | Discharge: 2021-05-27 | Disposition: A | Discharge status: Home / Self Care | Payer: Self-pay | Source: Ambulatory Visit | Attending: Family | Admitting: Family

## 2021-05-27 ENCOUNTER — Ambulatory Visit (HOSPITAL_COMMUNITY)
Admission: RE | Admit: 2021-05-27 | Discharge: 2021-05-27 | Disposition: A | Discharge status: Home / Self Care | Payer: Medicaid Other | Source: Ambulatory Visit | Attending: Family | Admitting: Family

## 2021-05-27 ENCOUNTER — Other Ambulatory Visit: Payer: Self-pay

## 2021-05-27 DIAGNOSIS — M79605 Pain in left leg: Secondary | ICD-10-CM

## 2021-05-27 DIAGNOSIS — G8929 Other chronic pain: Secondary | ICD-10-CM | POA: Insufficient documentation

## 2021-05-27 DIAGNOSIS — M545 Low back pain, unspecified: Secondary | ICD-10-CM | POA: Insufficient documentation

## 2021-05-27 IMAGING — MR MR [PERSON_NAME] LOW W/O CM*L*
4 of 5 series · 11 of 40 positions shown · non-contrast
Comparison: Knee radiographs [DATE]

CLINICAL DATA: Chronic left leg pain

EXAM:
MRI OF LOWER LEFT EXTREMITY WITHOUT CONTRAST
TECHNIQUE: Multiplanar, multisequence MR imaging of the left tibia/fibula was
performed. No intravenous contrast was administered.

[Series 2: STIR · coronal · 4.0mm · 0.86mm/px · 2 of 31 slices shown]
[im 1/31]
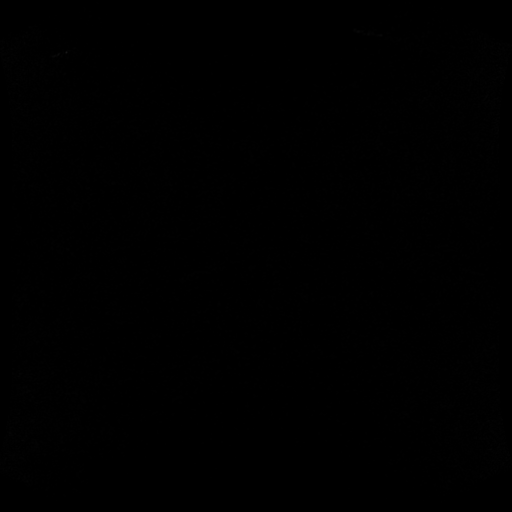
[im 21/31]
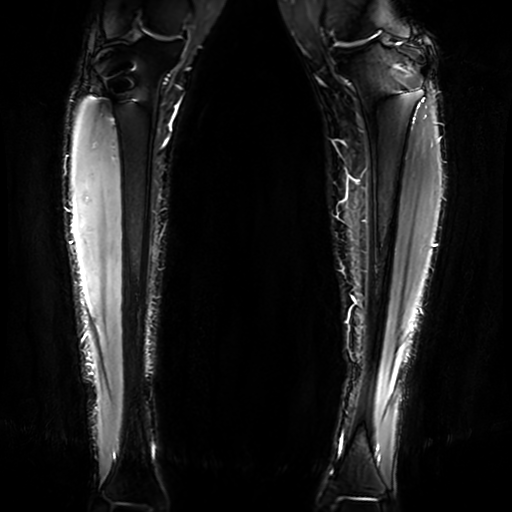

[Series 3: T1 · coronal · 4.0mm · 0.43mm/px · 3 of 31 slices shown (1 of 2)]
[im 1/31]
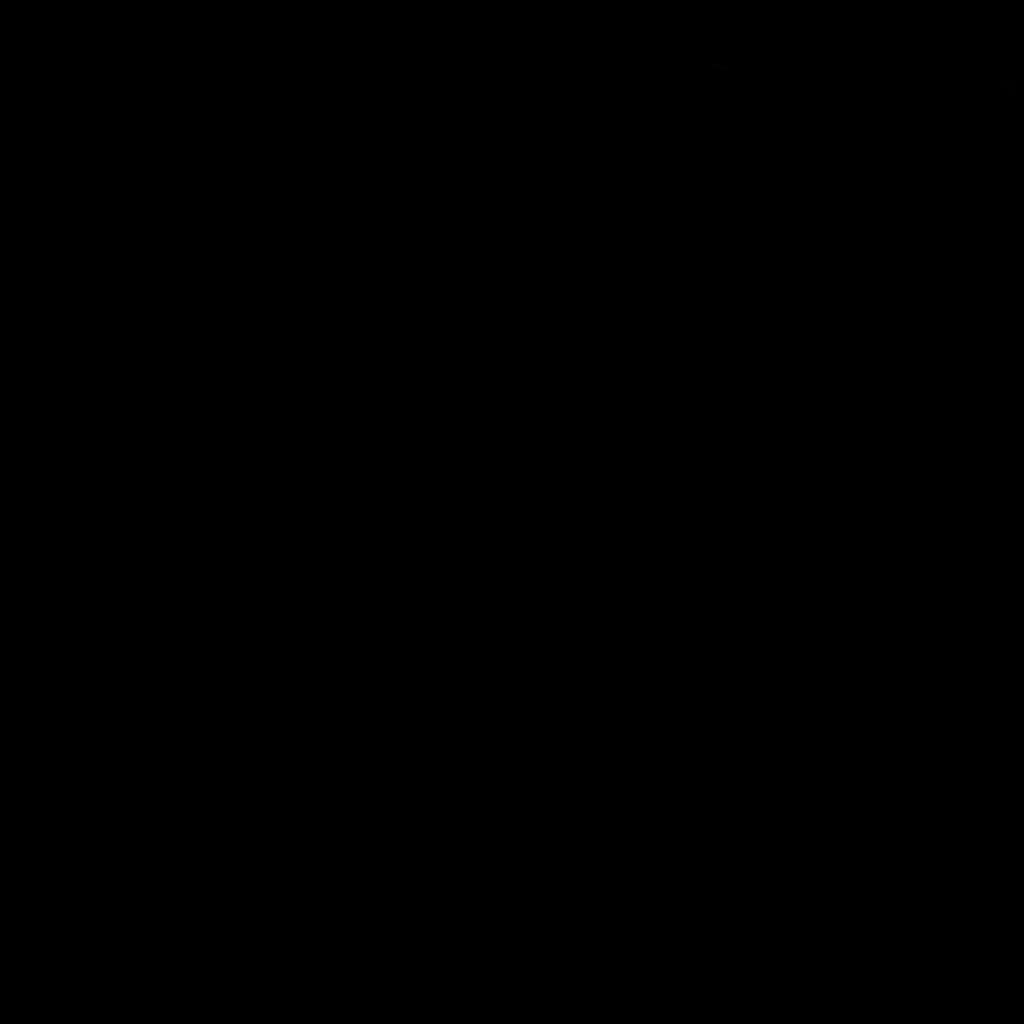
[im 16/31]
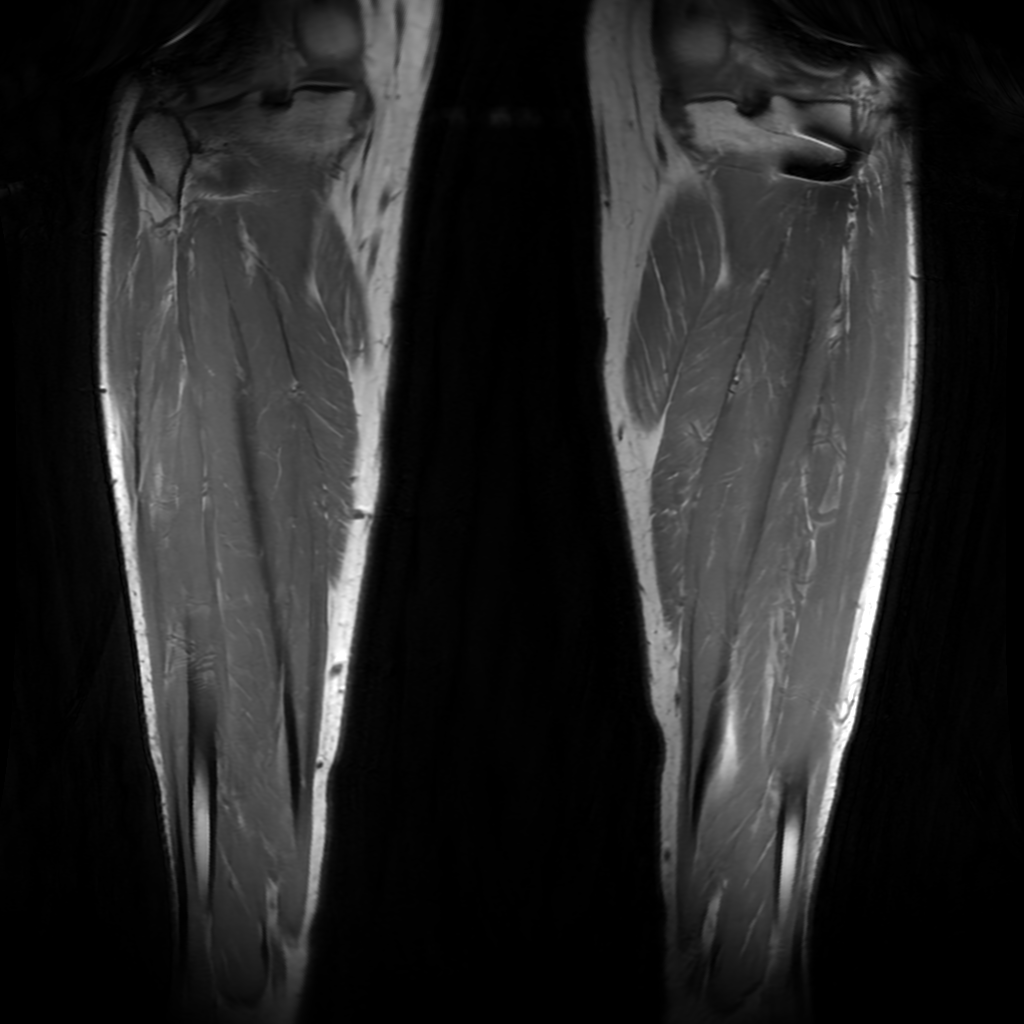
[im 31/31]
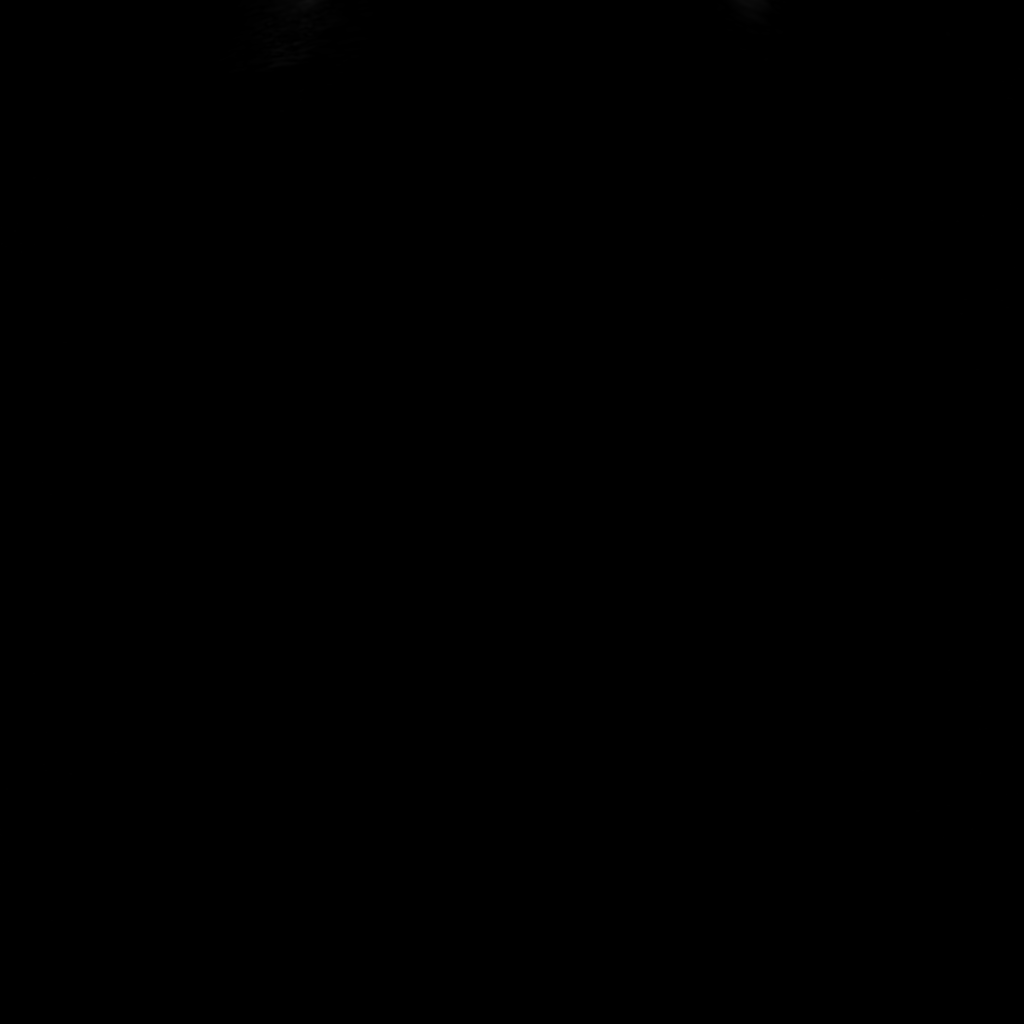

[Series 4: T2 fat-sat · axial · 4.0mm · 0.43mm/px · z∈[-147,+148]mm · 3 of 89 slices shown]
[im 15/89]
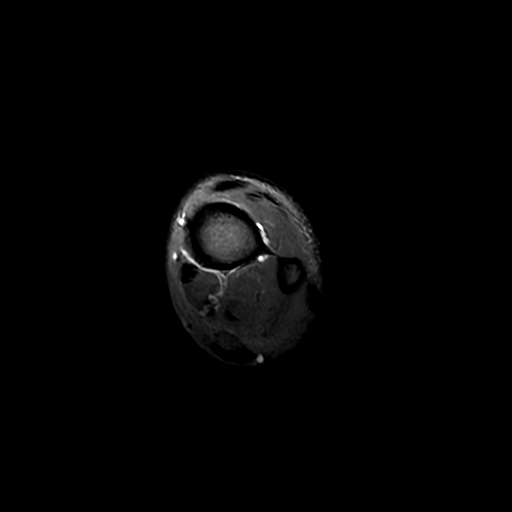
[im 45/89]
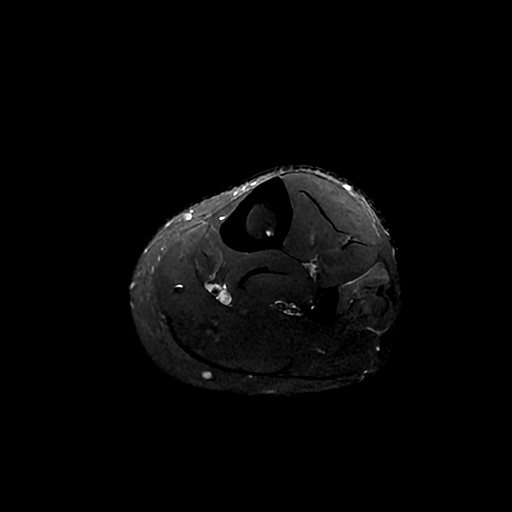
[im 74/89]
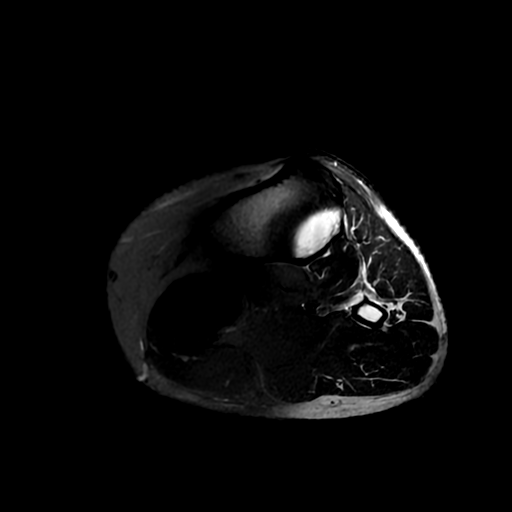

[Series 5: T1 · axial · 4.0mm · 0.43mm/px · z∈[-147,+148]mm · 3 of 89 slices shown (2 of 2)]
[im 15/89]
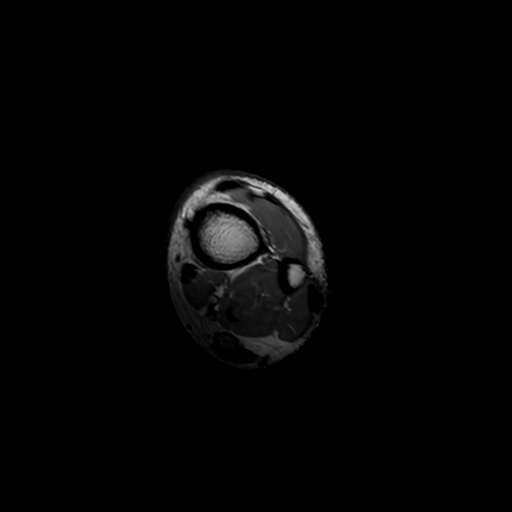
[im 45/89]
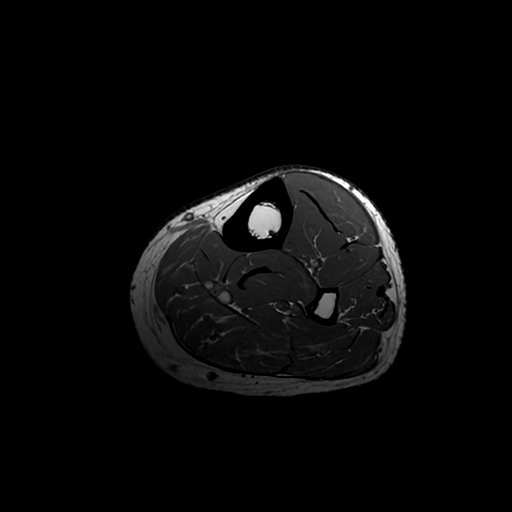
[im 74/89]
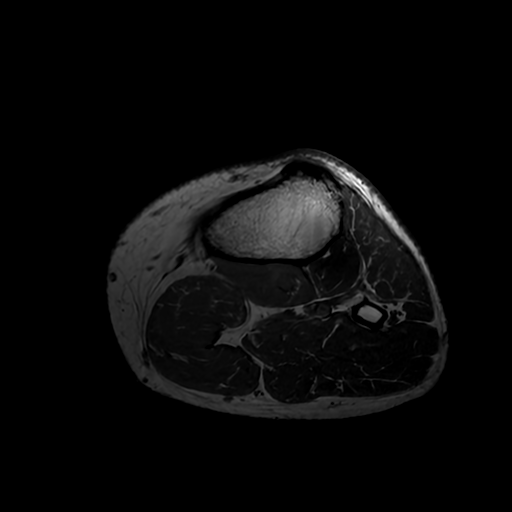

[11 of 40 positions shown; findings below may reference images not displayed]

FINDINGS: Bones/Joint/Cartilage

Symmetric lateral proximal tibial hardware noted, probably
reflecting proximal tibial osteotomy for tibia vara. No marrow edema
or complicating feature observed. For no additional significant bony
findings.

Ligaments

INSERT non

Muscles and Tendons

Unremarkable

Soft tissues

Unremarkable
IMPRESSION: 1. No specific abnormality to explain the patient's left lower leg
pain.
2. Symmetric lateral proximal tibial fixators likely related to
proximal tibial osteotomy for tibia vara.

## 2021-05-27 IMAGING — MR MR FEMUR*L* W/O CM
4 of 5 series · 12 of 40 positions shown · non-contrast
Comparison: Hip radiograph [DATE], left hip MRI from [DATE]

CLINICAL DATA: Chronic left leg pain

EXAM:
MR OF THE LEFT FEMUR WITHOUT CONTRAST
TECHNIQUE: Multiplanar, multisequence MR imaging of the left femur was
performed. No intravenous contrast was administered.

[Series 3: STIR · coronal · 4.0mm · 0.94mm/px · 3 of 52 slices shown]
[im 9/52]
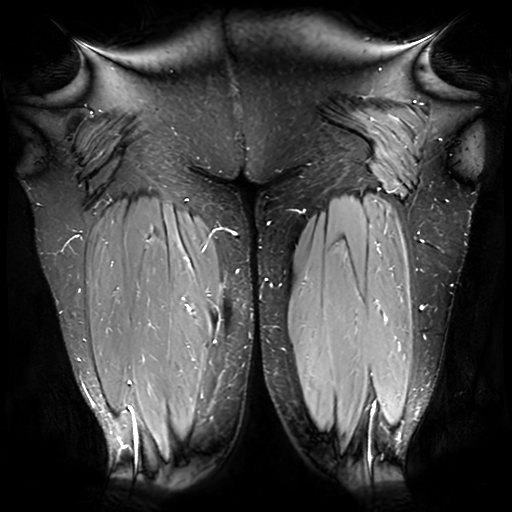
[im 26/52]
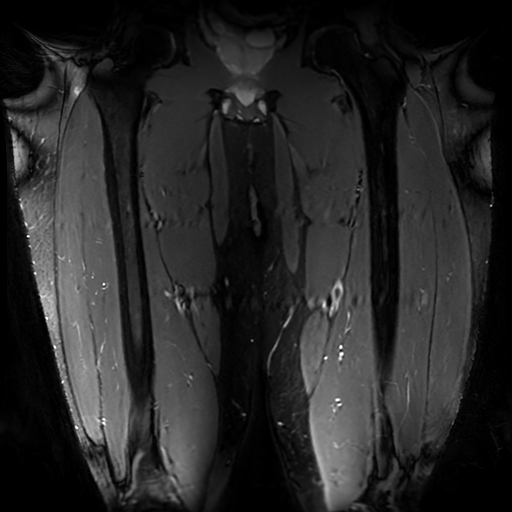
[im 43/52]
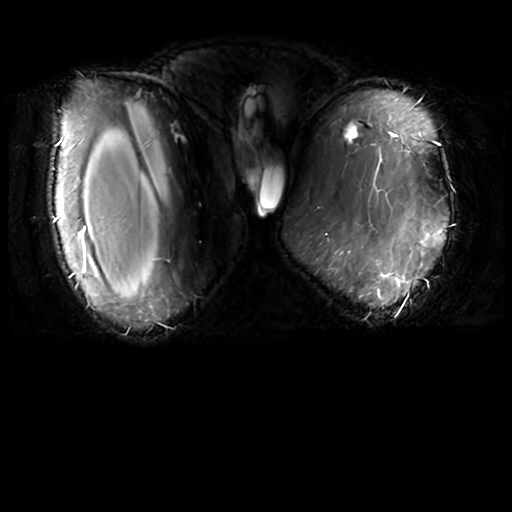

[Series 4: T1 · coronal · 4.0mm · 0.47mm/px · 3 of 52 slices shown (1 of 2)]
[im 11/52]
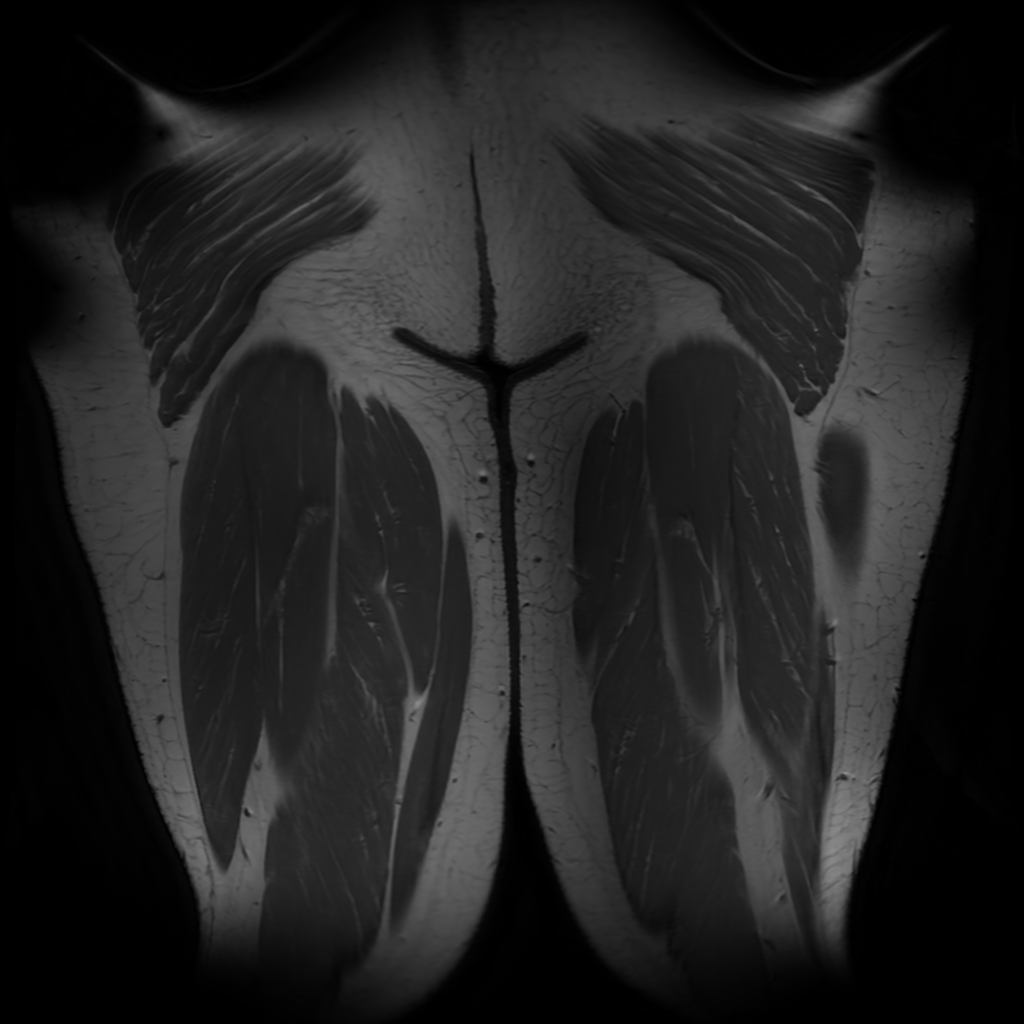
[im 31/52]
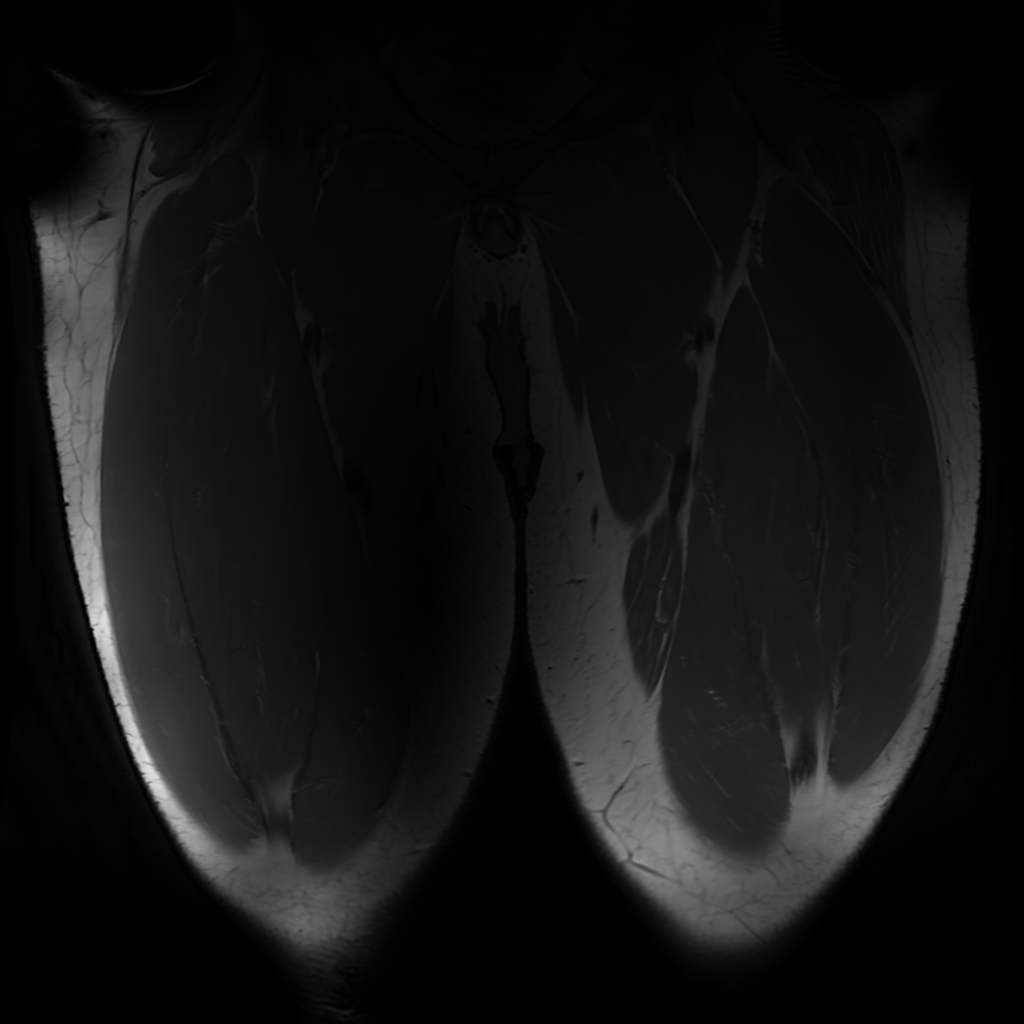
[im 52/52]
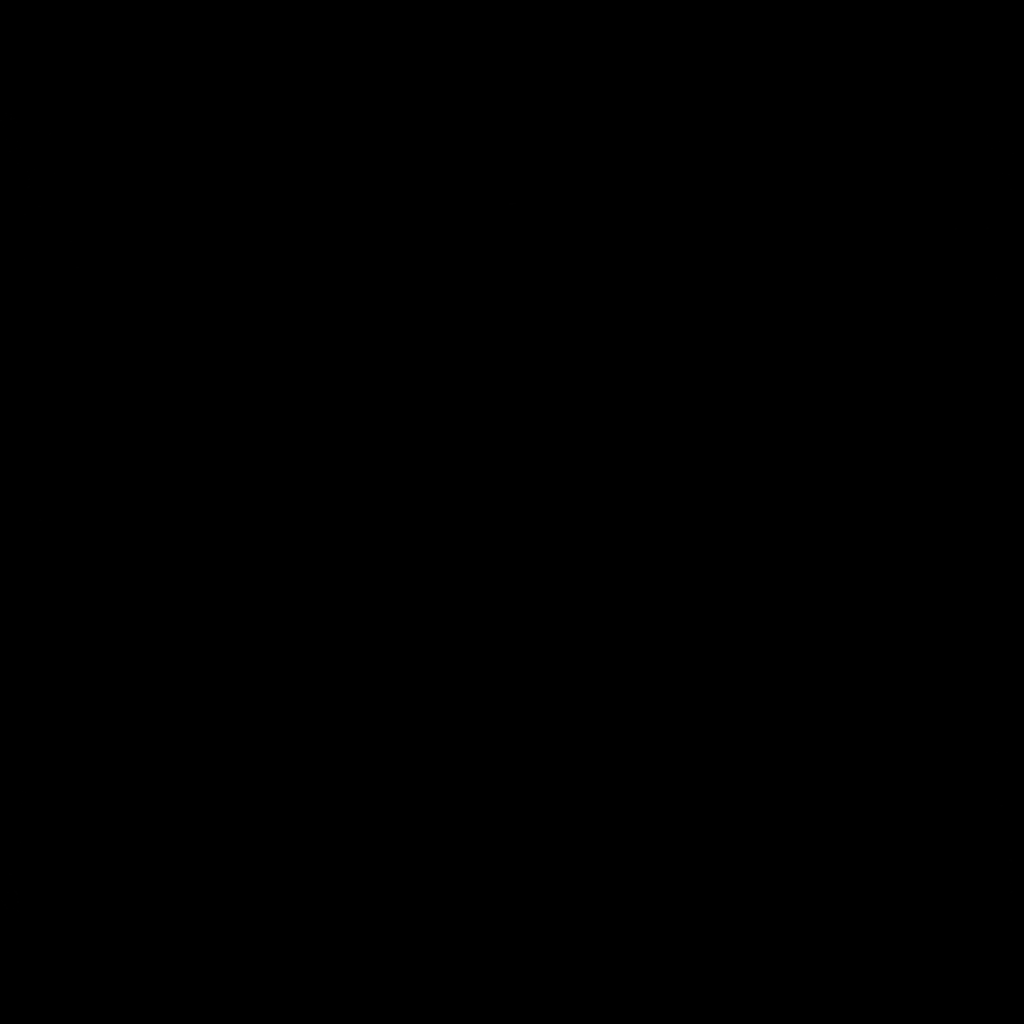

[Series 5: T1 · axial · 4.0mm · 0.43mm/px · z∈[-215,+185]mm · 3 of 101 slices shown (2 of 2)]
[im 11/101]
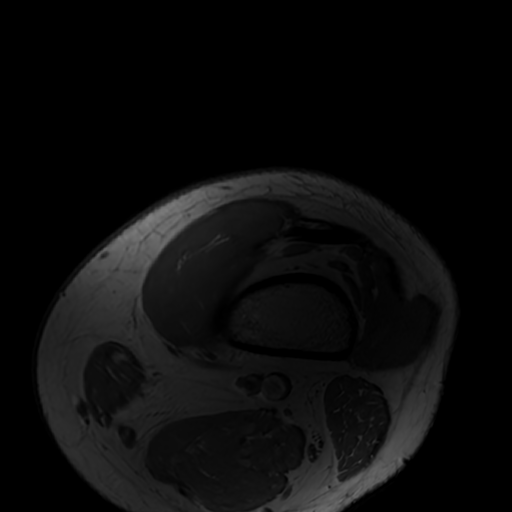
[im 51/101]
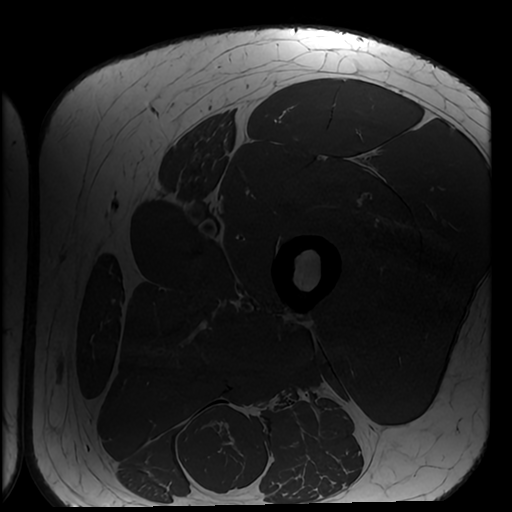
[im 91/101]
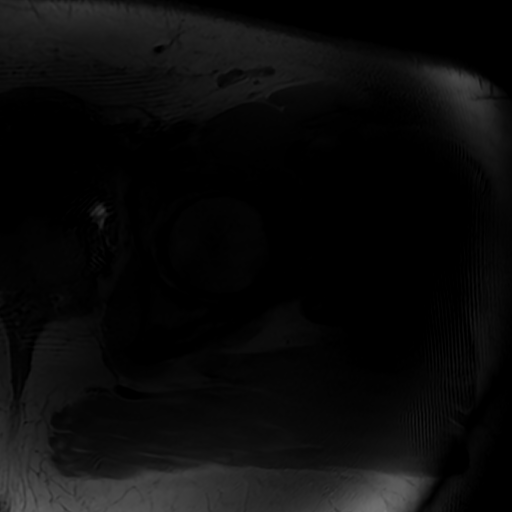

[Series 6: T2 fat-sat · axial · 4.0mm · 0.43mm/px · z∈[-215,+185]mm · 3 of 101 slices shown]
[im 11/101]
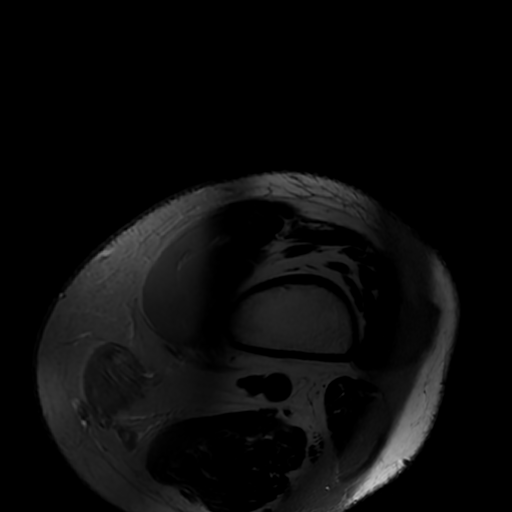
[im 51/101]
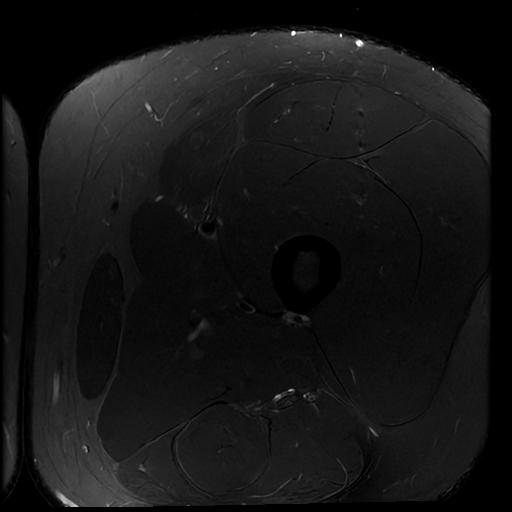
[im 91/101]
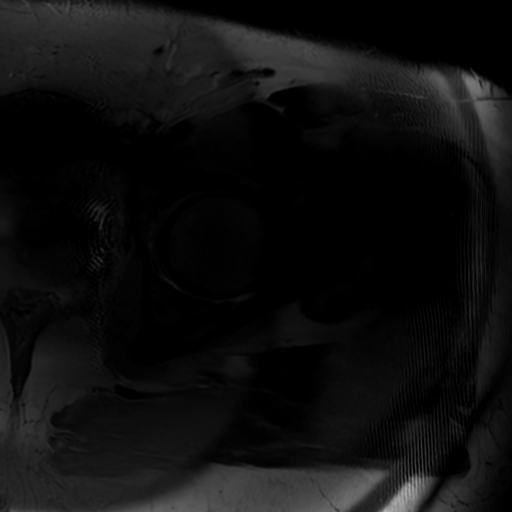

[12 of 40 positions shown; findings below may reference images not displayed]

FINDINGS: Bones/Joint/Cartilage

The knee region is distorted by boundary artifact. No significant
bony abnormality is observed.

Ligaments

N/A

Muscles and Tendons

Unremarkable

Soft tissues

Unremarkable
IMPRESSION: 1. No significant abnormality to explain the patient's left lower
extremity pain.

## 2021-05-27 IMAGING — MR MR LUMBAR SPINE W/O CM
4 of 5 series · 18 of 48 positions shown · non-contrast
Comparison: Radiograph from [DATE].

CLINICAL DATA: Initial evaluation for chronic bilateral lower back
pain.

EXAM:
MRI LUMBAR SPINE WITHOUT CONTRAST
TECHNIQUE: Multiplanar, multisequence MR imaging of the lumbar spine was
performed. No intravenous contrast was administered.

[Series 3: T2 · sagittal · 4.0mm · 0.51mm/px · 5 of 15 slices shown (1 of 2)]
[im 1/15]
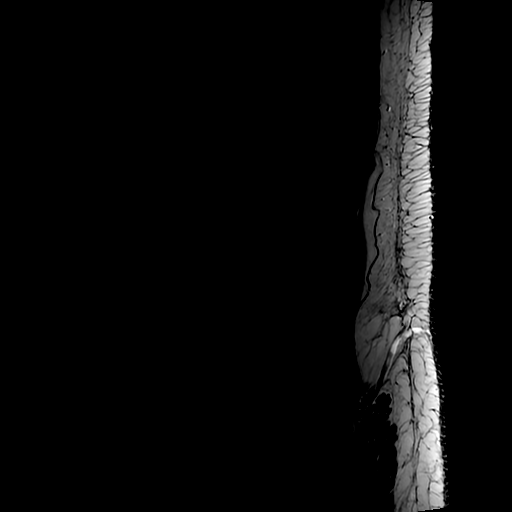
[im 4/15]
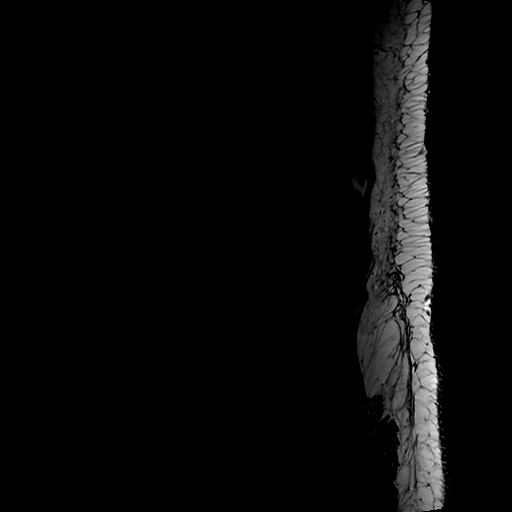
[im 8/15]
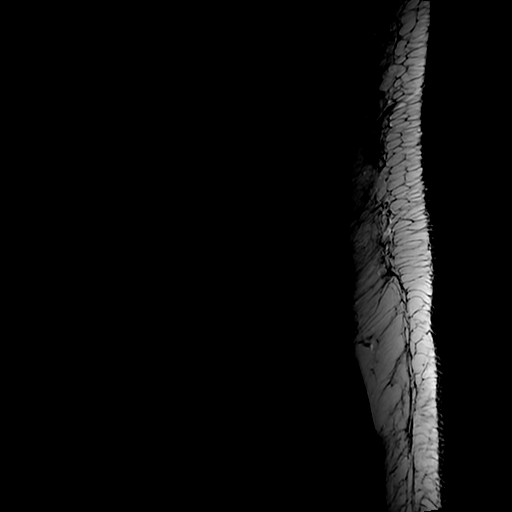
[im 11/15]
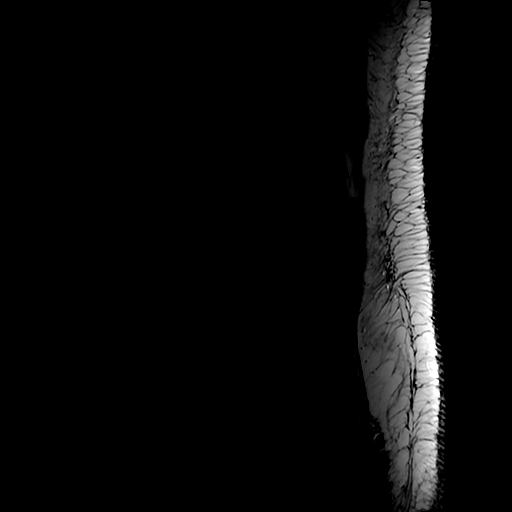
[im 15/15]
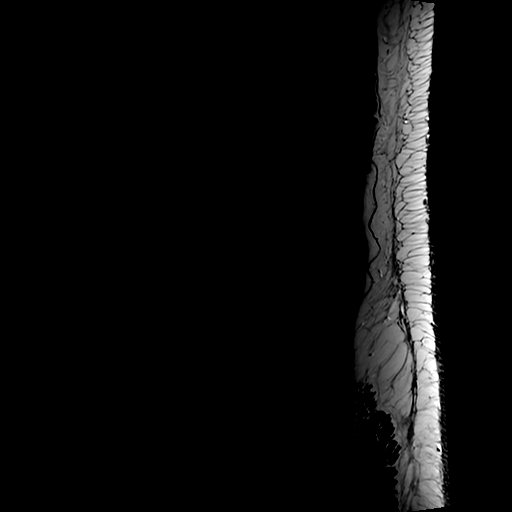

[Series 5: T1 · sagittal · 4.0mm · 0.51mm/px · 3 of 15 slices shown (1 of 2)]
[im 3/15]
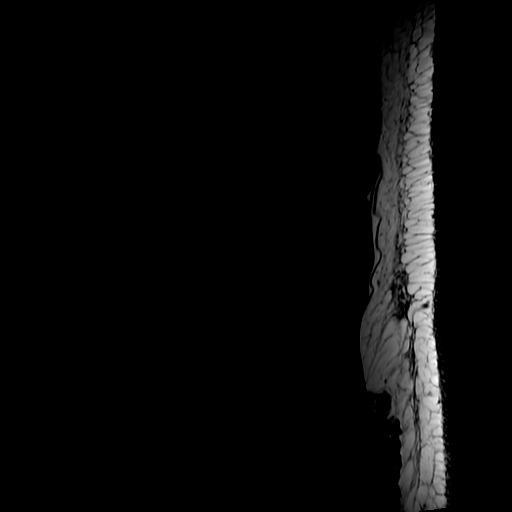
[im 9/15]
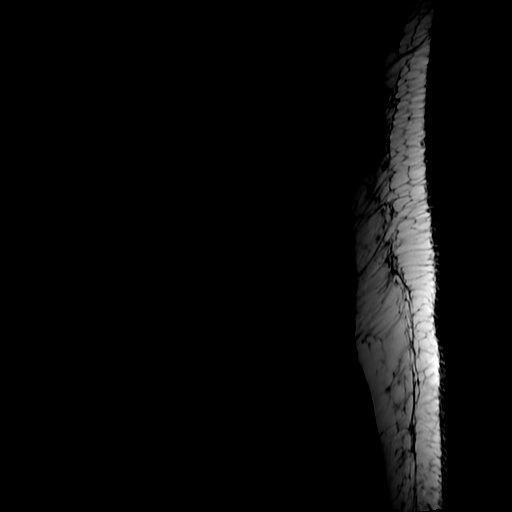
[im 15/15]
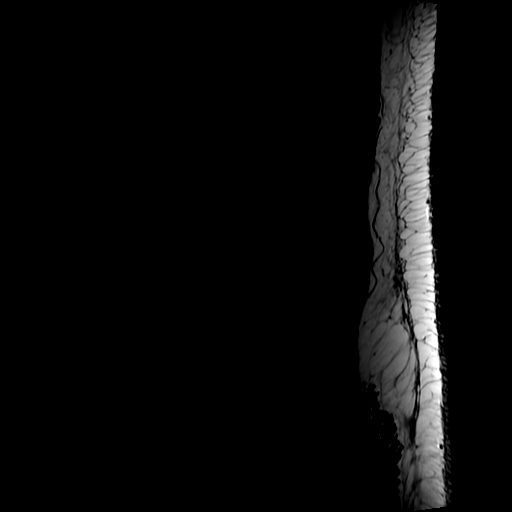

[Series 6: T2 · axial · 4.0mm · 0.39mm/px · z∈[+55,+219]mm · 7 of 42 slices shown (2 of 2)]
[im 3/42]
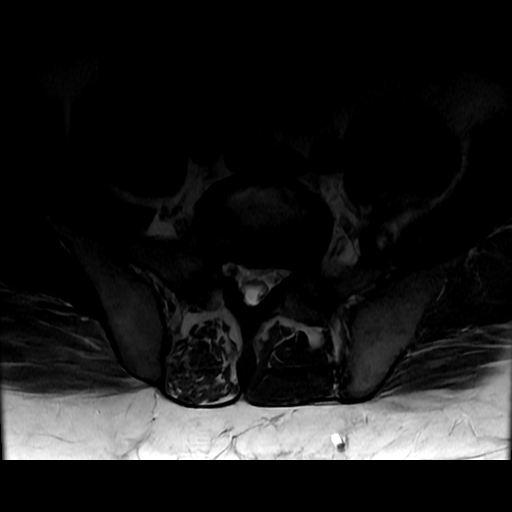
[im 6/42]
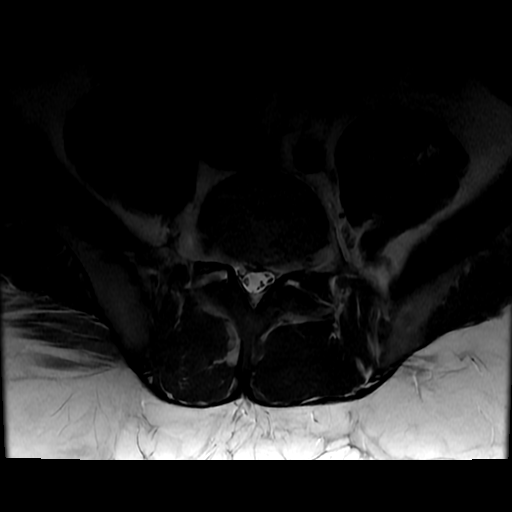
[im 9/42]
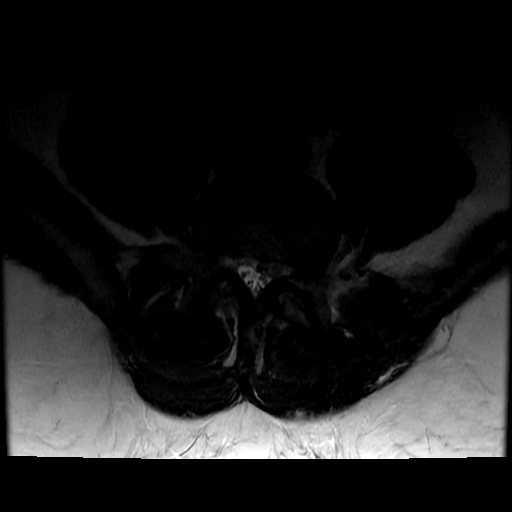
[im 14/42]
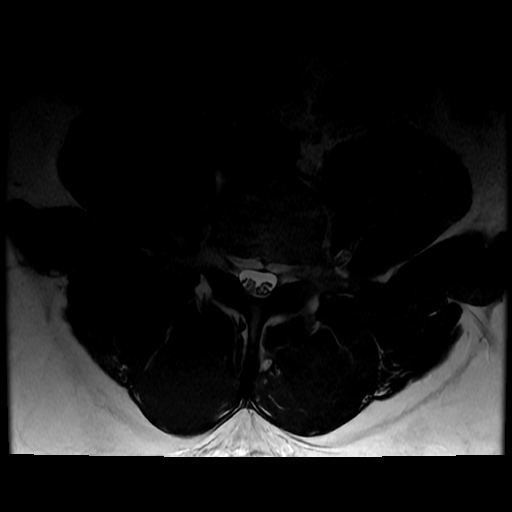
[im 20/42]
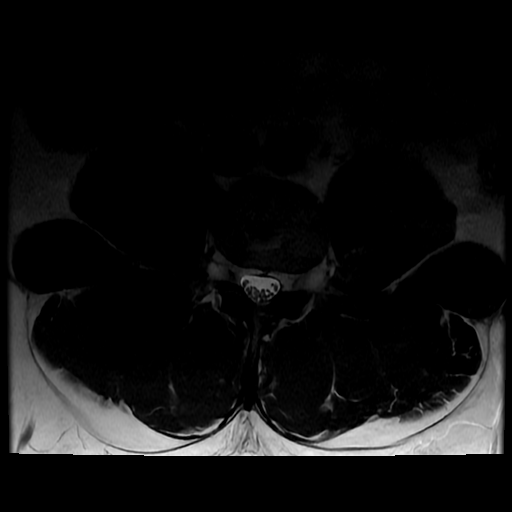
[im 22/42]
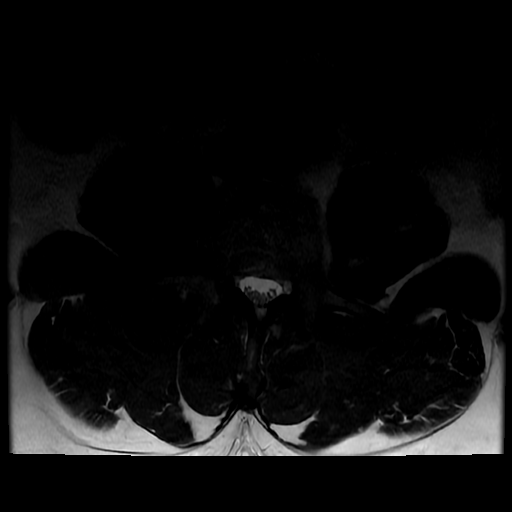
[im 36/42]
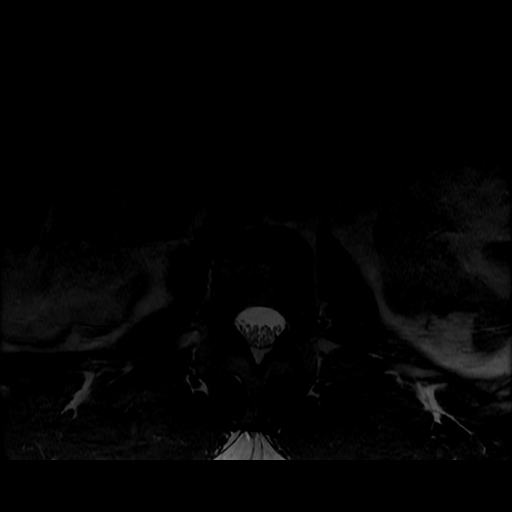

[Series 7: T1 · axial · 4.0mm · 0.39mm/px · z∈[+70,+219]mm · 3 of 42 slices shown (2 of 2)]
[im 6/42]
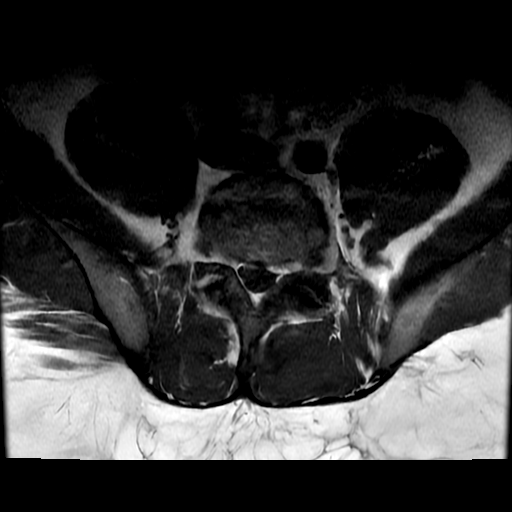
[im 22/42]
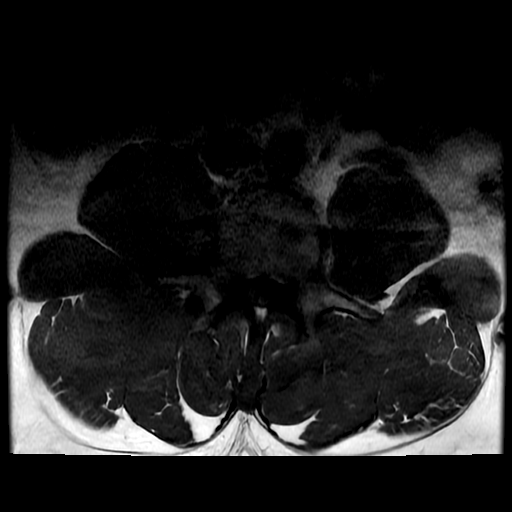
[im 36/42]
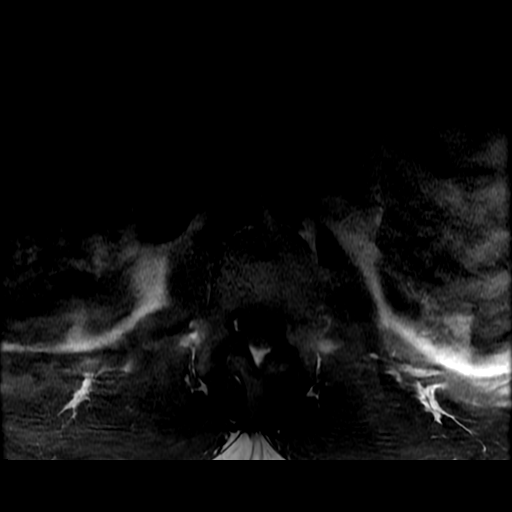

[18 of 48 positions shown; findings below may reference images not displayed]

FINDINGS: Segmentation: Standard. Lowest well-formed disc space labeled the
L5-S1 level.

Alignment: Straightening of the normal lumbar lordosis. No
listhesis.

Vertebrae: Vertebral body height maintained without acute or chronic
fracture. Bone marrow signal intensity within normal limits. No
discrete or worrisome osseous lesions. No abnormal marrow edema.

Conus medullaris and cauda equina: Conus extends to the T12 level.
Conus and cauda equina appear normal.

Paraspinal and other soft tissues: Asymmetric fatty atrophy noted
within the right posterior lower paraspinous musculature.
Paraspinous soft tissues demonstrate no acute finding. Left kidney
not visualized, and could be absent.

Disc levels:

L1-2:  Unremarkable.

L2-3: Minimal annular disc bulge. Mild facet hypertrophy. No spinal
stenosis. Foramina remain patent.

L3-4: Mild disc bulge with disc desiccation. Superimposed chronic
endplate Schmorl's node deformities. Mild bilateral facet
hypertrophy. No spinal stenosis. Foramina remain patent.

L4-5: Disc desiccation with mild disc bulge. Superimposed central to
left subarticular disc extrusion with inferior migration (series 6,
image 34). Impingement of the descending left L5 nerve root.
Superimposed mild facet hypertrophy. Resultant moderate canal with
moderate left worse than right lateral recess stenosis. Mild left L4
foraminal narrowing.

L5-S1: Disc desiccation with mild disc bulge. Superimposed broad
central to left foraminal disc protrusion (series 6, image 39). Left
greater than right facet hypertrophy. No significant spinal
stenosis. Mild left L5 foraminal narrowing. Right neural foramina
remains patent.
IMPRESSION: 1. Central to left subarticular disc extrusion with inferior
migration at L4-5, potentially impinging upon the descending left L5
nerve root. Resultant moderate canal with left worse than right
lateral recess stenosis.
2. Broad central to left foraminal disc protrusion at L5-S1 with
resultant mild left L5 foraminal stenosis.
3. Additional mild noncompressive disc bulging at L3-4 without
stenosis or impingement.
4. Nonvisualization of the left kidney, which could be absent and/or
ectopic. Correlation with history suggested.

## 2021-05-28 ENCOUNTER — Encounter: Payer: Self-pay | Admitting: Family

## 2021-05-28 ENCOUNTER — Other Ambulatory Visit: Payer: Self-pay | Admitting: Family

## 2021-05-28 DIAGNOSIS — Z905 Acquired absence of kidney: Secondary | ICD-10-CM

## 2021-05-28 DIAGNOSIS — M48061 Spinal stenosis, lumbar region without neurogenic claudication: Secondary | ICD-10-CM

## 2021-05-28 NOTE — Progress Notes (Signed)
Lumbar stenosis present. Referral to Orthopedics for evaluation and management .  Referral to Nephrology for evaluation and management of possible absent or ectopic left kidney.   Their office should call patient within 2 weeks with appointment details.

## 2021-05-30 NOTE — Progress Notes (Signed)
No abnormality

## 2021-05-30 NOTE — Progress Notes (Signed)
No abnormality.   Symmetric lateral proximal fixators are present. 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 Therefore, we do not need additional imaging or referral at this time. If new symptoms develop patient encouraged to let me know so that we can develop a new plan.

## 2021-06-02 ENCOUNTER — Telehealth: Payer: Self-pay | Admitting: Family

## 2021-06-02 NOTE — Telephone Encounter (Signed)
Pt was sent a letter from financial dept. Inform them, that the application they submitted was incomplete, since they were missing some documentation at the time of the appointment, Pt need to reschedule and resubmit all new papers and application for [employer] and [employer], P.S. old documents has been sent back by mail to the Pt and Pt. need to make a new appt. 

## 2021-06-09 ENCOUNTER — Ambulatory Visit: Payer: Self-pay | Admitting: Family Medicine

## 2021-06-17 ENCOUNTER — Ambulatory Visit (INDEPENDENT_AMBULATORY_CARE_PROVIDER_SITE_OTHER): Payer: 59 | Admitting: Orthopaedic Surgery

## 2021-06-17 ENCOUNTER — Other Ambulatory Visit: Payer: Self-pay

## 2021-06-17 ENCOUNTER — Encounter: Payer: Self-pay | Admitting: Family

## 2021-06-17 ENCOUNTER — Encounter: Payer: Self-pay | Admitting: Orthopaedic Surgery

## 2021-06-17 VITALS — BP 151/107 | HR 74 | Ht 71.0 in | Wt 272.0 lb

## 2021-06-17 DIAGNOSIS — M5442 Lumbago with sciatica, left side: Secondary | ICD-10-CM | POA: Diagnosis not present

## 2021-06-17 DIAGNOSIS — I10 Essential (primary) hypertension: Secondary | ICD-10-CM | POA: Diagnosis not present
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
## 2021-07-01 ENCOUNTER — Other Ambulatory Visit: Payer: Self-pay

## 2021-07-01 ENCOUNTER — Encounter: Payer: Self-pay | Admitting: Family

## 2021-07-01 DIAGNOSIS — M5442 Lumbago with sciatica, left side: Secondary | ICD-10-CM

## 2021-07-01 MED ORDER — MELOXICAM 15 MG PO TABS: 15.0000 mg | ORAL_TABLET | Freq: Every day | ORAL | 0 refills | Status: DC

## 2021-07-02 ENCOUNTER — Telehealth: Payer: Self-pay | Admitting: Family

## 2021-07-02 ENCOUNTER — Other Ambulatory Visit: Payer: Self-pay | Admitting: Physician Assistant

## 2021-07-02 DIAGNOSIS — M5442 Lumbago with sciatica, left side: Secondary | ICD-10-CM

## 2021-08-12 NOTE — Telephone Encounter (Signed)
Completed.

## 2021-08-18 ENCOUNTER — Other Ambulatory Visit: Payer: Self-pay

## 2021-08-18 ENCOUNTER — Encounter: Payer: Self-pay | Admitting: Orthopaedic Surgery

## 2021-08-18 ENCOUNTER — Ambulatory Visit (INDEPENDENT_AMBULATORY_CARE_PROVIDER_SITE_OTHER): Payer: 59 | Admitting: Orthopaedic Surgery

## 2021-08-18 VITALS — BP 152/95 | Ht 71.0 in | Wt 270.0 lb

## 2021-08-18 DIAGNOSIS — I10 Essential (primary) hypertension: Secondary | ICD-10-CM | POA: Diagnosis not present

## 2021-08-18 DIAGNOSIS — M48061 Spinal stenosis, lumbar region without neurogenic claudication: Secondary | ICD-10-CM

## 2021-08-18 DIAGNOSIS — Q7649 Other congenital malformations of spine, not associated with scoliosis: Secondary | ICD-10-CM

## 2021-08-18 NOTE — Progress Notes (Signed)
Office Visit Note   Patient: Aaron Alvarado           Date of Birth: 05-Dec-1995           MRN: [id number] Visit Date: 08/18/2021              Requested by: Rema Fendt, NP [address] [place],  [place] [address] PCP: Rema Fendt, NP   Assessment & Plan: Visit Diagnoses:  1. Primary hypertension   2. Lumbar foraminal stenosis   3. Congenital stenosis of lumbar spine     Plan: We discussed working on gradual weight loss avoiding activities that bother his back.  He needs to see his PCP about his blood pressure.  We discussed problems with uncontrolled hypertension with heart attack and stroke risks.  Follow-up here on an as-needed basis.  We reviewed his MRI and report again today.  Pathophysiology discussed.  Follow-Up Instructions: No follow-ups on file.   Orders:  No orders of the defined types were placed in this encounter.  No orders of the defined types were placed in this encounter.     Procedures: No procedures performed   Clinical Data: No additional findings.   Subjective: Chief Complaint  Patient presents with   Lower Back - Follow-up    HPI 25 year old male returns he states his lungs takes meloxicam his back is doing fine.  BMI is 37 he has short pedicles with congenital lumbar stenosis and had some disc extrusion at L4-5.  He has hypertension has not had it taken care of open I discussed with him he definitely needs to get his blood pressure seen by PCP and medication started.  We discussed the meloxicam may be increasing his blood pressure slightly.  Review of Systems positive for obesity GERD history of personality disorder.  Foraminal stenosis with lumbar disc protrusion.  All other systems noncontributory HPI.   Objective: Vital Signs: BP (!) 152/95   Ht 5\' 11"  (1.803 m)   Wt 270 lb (122.5 kg)   BMI 37.66 kg/m   Physical Exam Constitutional:      Appearance: He is well-developed.  HENT:     Head: Normocephalic and  atraumatic.     Right Ear: External ear normal.     Left Ear: External ear normal.  Eyes:     Pupils: Pupils are equal, round, and reactive to light.  Neck:     Thyroid: No thyromegaly.     Trachea: No tracheal deviation.  Cardiovascular:     Rate and Rhythm: Normal rate.  Pulmonary:     Effort: Pulmonary effort is normal.     Breath sounds: No wheezing.  Abdominal:     General: Bowel sounds are normal.     Palpations: Abdomen is soft.  Musculoskeletal:     Cervical back: Neck supple.  Skin:    General: Skin is warm and dry.     Capillary Refill: Capillary refill takes less than 2 seconds.  Neurological:     Mental Status: He is alert and oriented to person, place, and time.  Psychiatric:        Behavior: Behavior normal.        Thought Content: Thought content normal.        Judgment: Judgment normal.    Ortho Exam normal gait no limping negative logroll the hips negative straight leg raising.  He moves easily from sitting to standing on and off the exam table.  Specialty Comments:  No specialty  comments available.  Imaging: No results found.   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   HTN (hypertension) 10/26/2011   History of acanthosis skin now resolved 2015 10/26/2011   GERD (gastroesophageal reflux disease) 05/06/2011   Obesity (BMI 35.0-39.9 without comorbidity) 04/10/2010   Allergic rhinitis 01/12/2007   Past Medical History:  Diagnosis Date   Asthma     Blount's disease    Environmental allergies    Hypertension    PTSD (post-traumatic stress disorder)     Family History  Problem Relation Age of Onset   Hypertension Mother    Fibromyalgia Mother     Past Surgical History:  Procedure Laterality Date   KNEE ARTHROSCOPY  2011   Social History   Occupational History   Not on file  Tobacco Use   Smoking status: Never   Smokeless tobacco: Never  Vaping Use   Vaping Use: Every day   Substances: Nicotine  Substance and Sexual Activity   Alcohol use: Yes    Alcohol/week: 5.0 standard drinks    Types: 5 Standard drinks or equivalent per week    Comment: weekly   Drug use: Yes    Types: Marijuana    Comment: occassionally   Sexual activity: Yes    Partners: Female, Male    Birth control/protection: Condom    Comment: PrEP

## 2022-04-09 ENCOUNTER — Ambulatory Visit: Payer: Self-pay | Admitting: *Deleted

## 2022-04-09 ENCOUNTER — Encounter: Payer: Self-pay | Admitting: Physician Assistant

## 2022-04-09 ENCOUNTER — Telehealth: Payer: Medicaid Other | Admitting: Physician Assistant

## 2022-04-09 DIAGNOSIS — J039 Acute tonsillitis, unspecified: Secondary | ICD-10-CM

## 2022-04-09 MED ORDER — AZITHROMYCIN 250 MG PO TABS: ORAL_TABLET | ORAL | 0 refills | Status: AC

## 2022-04-09 MED ORDER — PREDNISONE 20 MG PO TABS: 40.0000 mg | ORAL_TABLET | Freq: Every day | ORAL | 0 refills | Status: AC

## 2022-04-09 NOTE — Progress Notes (Signed)
Virtual Visit Consent   Aaron Alvarado, you are scheduled for a virtual visit with a [facility] provider today. Just as with appointments in the office, your consent must be obtained to participate. Your consent will be active for this visit and any virtual visit you may have with one of our providers in the next 365 days. If you have a MyChart account, a copy of this consent can be sent to you electronically.  As this is a virtual visit, video technology does not allow for your provider to perform a traditional examination. This may limit your provider's ability to fully assess your condition. If your provider identifies any concerns that need to be evaluated in person or the need to arrange testing (such as labs, EKG, etc.), we will make arrangements to do so. Although advances in technology are sophisticated, we cannot ensure that it will always work on either your end or our end. If the connection with a video visit is poor, the visit may have to be switched to a telephone visit. With either a video or telephone visit, we are not always able to ensure that we have a secure connection.  By engaging in this virtual visit, you consent to the provision of healthcare and authorize for your insurance to be billed (if applicable) for the services provided during this visit. Depending on your insurance coverage, you may receive a charge related to this service.  I need to obtain your verbal consent now. Are you willing to proceed with your visit today? [name] has provided verbal consent on 04/09/2022 for a virtual visit (video or telephone). Piedad Climes, [place]  Date: 04/09/2022 9:43 AM  Virtual Visit via Video Note   I, Piedad Climes, connected with  MARIN MILLEY  ([id number], 1996-02-04) on 04/09/22 at  9:30 AM EDT by a video-enabled telemedicine application and verified that I am speaking with the correct person using two identifiers.  Location: Patient: Virtual Visit Location  Patient: Home Provider: Virtual Visit Location Provider: Home Office   I discussed the limitations of evaluation and management by telemedicine and the availability of in person appointments. The patient expressed understanding and agreed to proceed.    History of Present Illness: Aaron Alvarado is a 26 y.o. who identifies as a male who was assigned male at birth, and is being seen today for sore throat starting Tuesday evening and by Wednesday was pretty significant. Started drinking hot tea with lemon, taking Nyquil/Dayquil to help alleviate pain. Intermittent fevers for which he has taken Tylenol. Has had  avery mild, infrequent cough with this. Denies head congestion. Denies chest congestion.  No recent travel. 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   HTN (hypertension) 10/26/2011   History of acanthosis skin now resolved 2015 10/26/2011   GERD (gastroesophageal reflux disease) 05/06/2011   Obesity (BMI 35.0-39.9 without comorbidity) 04/10/2010  Allergic rhinitis 01/12/2007    Allergies:  Allergies  Allergen Reactions   Penicillins Hives and Swelling   Medications:  Current Outpatient Medications:    azithromycin (ZITHROMAX) 250 MG tablet, Take 2 tablets on day 1, then 1 tablet daily on days 2 through 5, Disp: 6 tablet, Rfl: 0   predniSONE (DELTASONE) 20 MG tablet, Take 2 tablets (40 mg total)  by mouth daily with breakfast for 3 days., Disp: 6 tablet, Rfl: 0  Observations/Objective: Patient is well-developed, well-nourished in no acute distress.  Resting comfortably at home.  Head is normocephalic, atraumatic.  No labored breathing. Speech is clear and coherent with logical content.  Patient is alert and oriented at baseline.  Posterior oropharyngeal erythema and edema noted. Uvula midline. R tonsil with start of exudate.   Assessment and Plan: 1. Tonsillitis - azithromycin (ZITHROMAX) 250 MG tablet; Take 2 tablets on day 1, then 1 tablet daily on days 2 through 5  Dispense: 6 tablet; Refill: 0 - predniSONE (DELTASONE) 20 MG tablet; Take 2 tablets (40 mg total) by mouth daily with breakfast for 3 days.  Dispense: 6 tablet; Refill: 0  Start Azithromycin as penicillin-allergic. Supportive measures and OTC medications reviewed in detail. Will give 3-day burst of prednisone giving amount of throat pain and inflammation.   Follow Up Instructions: I discussed the assessment and treatment plan with the patient. The patient was provided an opportunity to ask questions and all were answered. The patient agreed with the plan and demonstrated an understanding of the instructions.  A copy of instructions were sent to the patient via MyChart unless otherwise noted below.   The patient was advised to call back or seek an in-person evaluation if the symptoms worsen or if the condition fails to improve as anticipated.  Time:  I spent 10 minutes with the patient via telehealth technology discussing the above problems/concerns.    Piedad Climes, PA-C

## 2022-04-09 NOTE — Patient Instructions (Signed)
  Aaron Alvarado, thank you for joining Piedad Climes, PA-C for today's virtual visit.  While this provider is not your primary care provider (PCP), if your PCP is located in our provider database this encounter information will be shared with them immediately following your visit.  Consent: (Patient) Aaron Alvarado provided verbal consent for this virtual visit at the beginning of the encounter.  Current Medications:  Current Outpatient Medications:    azelastine (ASTELIN) 0.1 % nasal spray, Place 2 sprays into both nostrils 2 (two) times daily., Disp: 30 mL, Rfl: 0   cetirizine (ZYRTEC) 10 MG tablet, Take 1 tablet (10 mg total) by mouth daily., Disp: 30 tablet, Rfl: 8   cyclobenzaprine (FLEXERIL) 10 MG tablet, Take 1 tablet (10 mg total) by mouth 3 (three) times daily as needed for muscle spasms., Disp: 30 tablet, Rfl: 0   fluticasone (FLONASE) 50 MCG/ACT nasal spray, Place 1 spray into both nostrils daily., Disp: 16 g, Rfl: 0   ibuprofen (ADVIL) 800 MG tablet, Take 1 tablet (800 mg total) by mouth every 8 (eight) hours as needed. (Patient not taking: No sig reported), Disp: 30 tablet, Rfl: 0   meloxicam (MOBIC) 15 MG tablet, Take 1 tablet by mouth once daily, Disp: 30 tablet, Rfl: 0   Medications ordered in this encounter:  No orders of the defined types were placed in this encounter.    *If you need refills on other medications prior to your next appointment, please contact your pharmacy*  Follow-Up: Call back or seek an in-person evaluation if the symptoms worsen or if the condition fails to improve as anticipated.  Other Instructions Please keep well-hydrated and get plenty of rest. Tylenol for throat pain for now. Once done with the prednisone, you can alternate Tylenol and Ibuprofen if needed. Salt-water gargles and chloraseptic spray may also be beneficial. Take the antibiotic and steroid as directed.  If symptoms are not resolving with treatment given or you have any  new/worsening symptoms despite treatment, you will require an in-person evaluation. DO NOT DELAY CARE!   If you have been instructed to have an in-person evaluation today at a local Urgent Care facility, please use the link below. It will take you to a list of all of our available [facility], including address, phone number and hours of operation. Please do not delay care.  [facility]  If you or a family member do not have a primary care provider, use the link below to schedule a visit and establish care. When you choose a [facility] primary care physician or advanced practice provider, you gain a long-term partner in health. Find a Primary Care Provider  Learn more about [facility]'s in-office and virtual care options:  - Get Care Now

## 2022-04-09 NOTE — Telephone Encounter (Signed)
  Chief Complaint: sore throat Symptoms: sore throat, cough Frequency: Tuesday  Pertinent Negatives: Patient denies fever Disposition: [] ED /[] Urgent Care (no appt availability in office) / [] Appointment(In office/virtual)/ [x]  [facility]/ [] Home Care/ [] Refused Recommended Disposition /[] [facility]/ []  Follow-up with PCP Additional Notes:

## 2022-04-09 NOTE — Telephone Encounter (Signed)
Reason for Disposition  [1] Pus on tonsils (back of throat) AND [2]  fever AND [3] swollen neck lymph nodes ("glands")  Answer Assessment - Initial Assessment Questions 1. ONSET: "When did the throat start hurting?" (Hours or days ago)      Tuesday evening 2. SEVERITY: "How bad is the sore throat?" (Scale 1-10; mild, moderate or severe)   - MILD (1-3):  doesn't interfere with eating or normal activities   - MODERATE (4-7): interferes with eating some solids and normal activities   - SEVERE (8-10):  excruciating pain, interferes with most normal activities   - SEVERE DYSPHAGIA: can't swallow liquids, drooling     Pain is 8 3. STREP EXPOSURE: "Has there been any exposure to strep within the past week?" If Yes, ask: "What type of contact occurred?"      No 4.  VIRAL SYMPTOMS: "Are there any symptoms of a cold, such as a runny nose, cough, hoarse voice or red eyes?"      congestion, ear pressure 5. FEVER: "Do you have a fever?" If Yes, ask: "What is your temperature, how was it measured, and when did it start?"     Not checked 6. PUS ON THE TONSILS: "Is there pus on the tonsils in the back of your throat?"     Red bumps 7. OTHER SYMPTOMS: "Do you have any other symptoms?" (e.g., difficulty breathing, headache, rash)     no 8. PREGNANCY: "Is there any chance you are pregnant?" "When was your last menstrual period?"  Protocols used: Sore Throat-A-AH

## 2022-04-16 ENCOUNTER — Ambulatory Visit
Admission: EM | Admit: 2022-04-16 | Discharge: 2022-04-16 | Disposition: A | Discharge status: Home / Self Care | Payer: Medicaid Other | Attending: Internal Medicine | Admitting: Internal Medicine

## 2022-04-16 DIAGNOSIS — Z113 Encounter for screening for infections with a predominantly sexual mode of transmission: Secondary | ICD-10-CM | POA: Insufficient documentation

## 2022-04-16 NOTE — Discharge Instructions (Signed)
Your STD testing is pending. Please refrain from sexual activity until test results and treatment are complete. Follow up if symptoms persist or worsen.

## 2022-04-16 NOTE — ED Provider Notes (Signed)
[facility]    CSN: [id number] Arrival date & time: 04/16/22  1509      History   Chief Complaint Chief Complaint  Patient presents with   sti screening    HPI Aaron Alvarado is a 26 y.o. male.   Patient presents for routine STD testing.  He would like oral swab, rectal swab, penile swab as well as blood work for HIV and syphilis to be completed.  Patient recently performed oral sexual intercourse on a new male sexual partner. Did not have penetrative intercourse. He denies any associated symptoms including penile discharge, sore throat, dysuria, testicular pain, flulike symptoms.  Patient reports that his sexual partner stated that he was HIV negative.  Although, patient is still concerned for STD exposure.    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   Acute left-sided low back pain with left-sided sciatica 03/26/2014   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   HTN (hypertension) 10/26/2011   History of acanthosis skin now resolved 2015 10/26/2011   GERD (gastroesophageal reflux disease) 05/06/2011   Obesity (BMI 35.0-39.9 without comorbidity) 04/10/2010   Allergic rhinitis 01/12/2007    Past Surgical History:  Procedure Laterality Date   KNEE ARTHROSCOPY  2011       Home Medications    Prior to Admission medications   Not on File    Family History Family History  Problem Relation Age of Onset   Hypertension  Mother    Fibromyalgia Mother     Social History Social History   Tobacco Use   Smoking status: Never   Smokeless tobacco: Never  Vaping Use   Vaping Use: Every day   Substances: Nicotine  Substance Use Topics   Alcohol use: Yes    Alcohol/week: 5.0 standard drinks    Types: 5 Standard drinks or equivalent per week    Comment: weekly   Drug use: Yes    Types: Marijuana    Comment: occassionally     Allergies   Penicillins   Review of Systems Review of Systems Per HPI  Physical Exam Triage Vital Signs ED Triage Vitals  Enc Vitals Group     BP 04/16/22 1550 (!) 143/98     Pulse Rate 04/16/22 1544 87     Resp 04/16/22 1544 18     Temp 04/16/22 1544 98 F (36.7 C)     Temp Source 04/16/22 1544 Oral     SpO2 04/16/22 1544 98 %     Weight --      Height --      Head Circumference --      Peak Flow --      Pain Score 04/16/22 1544 0     Pain Loc --      Pain Edu? --      Excl. in [place]? --    No data found.  Updated Vital Signs BP (!) 143/98 (BP Location: Right Arm)  Pulse 87   Temp 98 F (36.7 C) (Oral)   Resp 18   SpO2 98%   Visual Acuity Right Eye Distance:   Left Eye Distance:   Bilateral Distance:    Right Eye Near:   Left Eye Near:    Bilateral Near:     Physical Exam Exam conducted with a chaperone present.  Constitutional:      General: He is not in acute distress.    Appearance: Normal appearance. He is not toxic-appearing or diaphoretic.  HENT:     Head: Normocephalic and atraumatic.  Eyes:     Extraocular Movements: Extraocular movements intact.     Conjunctiva/sclera: Conjunctivae normal.  Pulmonary:     Effort: Pulmonary effort is normal.  Genitourinary:    Penis: Normal.      Testes: Normal. Cremasteric reflex is present.  Neurological:     General: No focal deficit present.     Mental Status: He is alert and oriented to person, place, and time. Mental status is at baseline.  Psychiatric:        Mood and Affect: Mood  normal.        Behavior: Behavior normal.        Thought Content: Thought content normal.        Judgment: Judgment normal.     UC Treatments / Results  Labs (all labs ordered are listed, but only abnormal results are displayed) Labs Reviewed  RPR  HIV ANTIBODY (ROUTINE TESTING W REFLEX)  CYTOLOGY, (ORAL, ANAL, URETHRAL) ANCILLARY ONLY  CYTOLOGY, (ORAL, ANAL, URETHRAL) ANCILLARY ONLY  CYTOLOGY, (ORAL, ANAL, URETHRAL) ANCILLARY ONLY    EKG   Radiology No results found.  Procedures Procedures (including critical care time)  Medications Ordered in UC Medications - No data to display  Initial Impression / Assessment and Plan / UC Course  I have reviewed the triage vital signs and the nursing notes.  Pertinent labs & imaging results that were available during my care of the patient were reviewed by me and considered in my medical decision making (see chart for details).     Routine STD testing pending.  Patient denies confirmed exposure so will await results for any further treatment at this time.  Patient to refrain from sexual activity until test results and treatment are complete.  Discussed return precautions.  Patient verbalized understanding and was agreeable with plan. Final Clinical Impressions(s) / UC Diagnoses   Final diagnoses:  Screening examination for venereal disease     Discharge Instructions      Your STD testing is pending. Please refrain from sexual activity until test results and treatment are complete. Follow up if symptoms persist or worsen.     ED Prescriptions   None    PDMP not reviewed this encounter.   Teodora Medici, [place] 04/16/22 1620

## 2022-04-16 NOTE — ED Triage Notes (Signed)
Sti screening 2/2 possible exposure. Oral swab requested. Blood work requested. Requested full sti panel.

## 2022-04-17 LAB — HIV ANTIBODY (ROUTINE TESTING W REFLEX): HIV Screen 4th Generation wRfx: NONREACTIVE

## 2022-04-17 LAB — RPR: RPR Ser Ql: NONREACTIVE

## 2022-04-19 LAB — CYTOLOGY, (ORAL, ANAL, URETHRAL) ANCILLARY ONLY
Chlamydia: NEGATIVE
Chlamydia: NEGATIVE
Chlamydia: NEGATIVE
Comment: NEGATIVE
Comment: NEGATIVE
Comment: NEGATIVE
Comment: NEGATIVE
Comment: NEGATIVE
Comment: NEGATIVE
Comment: NORMAL
Comment: NORMAL
Comment: NORMAL
Neisseria Gonorrhea: NEGATIVE
Neisseria Gonorrhea: NEGATIVE
Neisseria Gonorrhea: NEGATIVE
Trichomonas: NEGATIVE
Trichomonas: NEGATIVE
Trichomonas: NEGATIVE

## 2022-04-22 ENCOUNTER — Telehealth (HOSPITAL_COMMUNITY): Payer: Self-pay | Admitting: Emergency Medicine

## 2022-04-22 NOTE — Telephone Encounter (Signed)
Per Aaron Alvarado, APP on patient's recent lab work with [place] "can you have him follow up either with center for infectious disease or urgent care in about 3 months for retesting"? Attempted to reach patient x 1, unable to LVM

## 2022-04-23 NOTE — Telephone Encounter (Signed)
Attempted to reach patient x 2 

## 2022-07-02 ENCOUNTER — Encounter (HOSPITAL_BASED_OUTPATIENT_CLINIC_OR_DEPARTMENT_OTHER): Payer: Self-pay

## 2022-07-02 ENCOUNTER — Emergency Department (HOSPITAL_BASED_OUTPATIENT_CLINIC_OR_DEPARTMENT_OTHER)
Admission: EM | Admit: 2022-07-02 | Discharge: 2022-07-02 | Disposition: A | Discharge status: Home / Self Care | Payer: Medicaid Other | Attending: Emergency Medicine | Admitting: Emergency Medicine

## 2022-07-02 DIAGNOSIS — R369 Urethral discharge, unspecified: Secondary | ICD-10-CM

## 2022-07-02 DIAGNOSIS — I10 Essential (primary) hypertension: Secondary | ICD-10-CM | POA: Insufficient documentation

## 2022-07-02 DIAGNOSIS — R300 Dysuria: Secondary | ICD-10-CM

## 2022-07-02 LAB — URINALYSIS, ROUTINE W REFLEX MICROSCOPIC
Bilirubin Urine: NEGATIVE
Glucose, UA: NEGATIVE mg/dL
Ketones, ur: NEGATIVE mg/dL
Nitrite: NEGATIVE
Protein, ur: 100 mg/dL — AB
Specific Gravity, Urine: >=1.03 (ref 1.005–1.030)
pH: 6 (ref 5.0–8.0)

## 2022-07-02 LAB — URINALYSIS, MICROSCOPIC (REFLEX): WBC, UA: >50 WBC/hpf (ref 0–5)

## 2022-07-02 LAB — HIV ANTIBODY (ROUTINE TESTING W REFLEX): HIV Screen 4th Generation wRfx: NONREACTIVE

## 2022-07-02 MED ORDER — LIDOCAINE HCL (PF) 1 % IJ SOLN
INTRAMUSCULAR | Status: AC
  Filled 2022-07-02: qty 5

## 2022-07-02 MED ORDER — DOXYCYCLINE HYCLATE 100 MG PO CAPS: 100.0000 mg | ORAL_CAPSULE | Freq: Two times a day (BID) | ORAL | 0 refills | Status: AC

## 2022-07-02 MED ORDER — DOXYCYCLINE HYCLATE 100 MG PO TABS
100.0000 mg | ORAL_TABLET | Freq: Once | ORAL | Status: AC
  Administered 2022-07-02: 100 mg via ORAL
  Filled 2022-07-02: qty 1

## 2022-07-02 MED ORDER — CEFTRIAXONE SODIUM 500 MG IJ SOLR
500.0000 mg | Freq: Once | INTRAMUSCULAR | Status: AC
  Administered 2022-07-02: 500 mg via INTRAMUSCULAR
  Filled 2022-07-02: qty 500

## 2022-07-02 NOTE — ED Provider Notes (Signed)
[facility] EMERGENCY DEPARTMENT Provider Note   CSN: [id number] Arrival date & time: 07/02/22  0946     History PMH: Obesity, GERD, HTN Chief Complaint  Patient presents with   Penile Discharge    Aaron Alvarado is a 26 y.o. male. Presents the ED with penis discharge that is yellow in color as well as dysuria for 3 days.  He does state that he is sexually active with male partners.  He typically participates in penetrative anal sex.  He has had multiple partners who he has participated oral sex with her he has received and given.  He is not using protection.  One of his partners have had symptoms that he knows of.  He denies any fevers, chills, abdominal pain, nausea, vomiting, flank pain, sore throat, arthralgias, or joint swelling.   Penile Discharge       Home Medications Prior to Admission medications   Medication Sig Start Date End Date Taking? Authorizing Provider  doxycycline (VIBRAMYCIN) 100 MG capsule Take 1 capsule (100 mg total) by mouth 2 (two) times daily for 7 days. 07/02/22 07/09/22 Yes Kendyn Zaman, Finis Bud, PA-C      Allergies    Penicillins    Review of Systems   Review of Systems  Constitutional:  Negative for fever.  HENT:  Negative for sore throat.   Genitourinary:  Positive for dysuria and penile discharge. Negative for flank pain, genital sores, scrotal swelling and testicular pain.    Physical Exam Updated Vital Signs BP (!) 144/97   Pulse 77   Temp 97.8 F (36.6 C)   Resp 18   Ht 5\' 8"  (1.727 m)   Wt 122.5 kg   SpO2 96%   BMI 41.05 kg/m  Physical Exam Vitals and nursing note reviewed.  Constitutional:      General: He is not in acute distress.    Appearance: Normal appearance. He is well-developed. He is not ill-appearing, toxic-appearing or diaphoretic.  HENT:     Head: Normocephalic and atraumatic.     Nose: No nasal deformity.     Mouth/Throat:     Lips: Pink. No lesions.  Eyes:     General: Gaze aligned appropriately.  No scleral icterus.       Right eye: No discharge.        Left eye: No discharge.     Conjunctiva/sclera: Conjunctivae normal.     Right eye: Right conjunctiva is not injected. No exudate or hemorrhage.    Left eye: Left conjunctiva is not injected. No exudate or hemorrhage. Pulmonary:     Effort: Pulmonary effort is normal. No respiratory distress.  Skin:    General: Skin is warm and dry.  Neurological:     Mental Status: He is alert and oriented to person, place, and time.  Psychiatric:        Mood and Affect: Mood normal.        Speech: Speech normal.        Behavior: Behavior normal. Behavior is cooperative.     ED Results / Procedures / Treatments   Labs (all labs ordered are listed, but only abnormal results are displayed) Labs Reviewed  URINALYSIS, ROUTINE W REFLEX MICROSCOPIC - Abnormal; Notable for the following components:      Result Value   APPearance CLOUDY (*)    Hgb urine dipstick MODERATE (*)    Protein, ur 100 (*)    Leukocytes,Ua MODERATE (*)    All other components within normal limits  URINALYSIS,  MICROSCOPIC (REFLEX) - Abnormal; Notable for the following components:   Bacteria, UA MANY (*)    All other components within normal limits  RPR  HIV ANTIBODY (ROUTINE TESTING W REFLEX)  GC/CHLAMYDIA PROBE AMP ([place]) NOT AT [place]    EKG None  Radiology No results found.  Procedures Procedures   Medications Ordered in ED Medications  cefTRIAXone (ROCEPHIN) injection 500 mg (500 mg Intramuscular Given 07/02/22 1053)  doxycycline (VIBRA-TABS) tablet 100 mg (100 mg Oral Given 07/02/22 1055)  lidocaine (PF) (XYLOCAINE) 1 % injection (  Given 07/02/22 1106)    ED Course/ Medical Decision Making/ A&P                           Medical Decision Making Amount and/or Complexity of Data Reviewed Labs: ordered.  Risk Prescription drug management.   Patient presenting with dysuria and penis discharge for 2 days. He is in no acute distress and has  stable vitals here in the ED. he has multiple risk factors for STD as he is participating in anal sex and has multiple partners without protection.  I reviewed his recent STD testing from June 2023 where he is negative for all STDs including HIV and syphilis.  Given that he has new concerning symptoms, I recommend repeating all of this testing which patient agrees to. Plan to treat for STDs ppx with Doxycycline and Rocephin. No other concerning findings. Patient is stable for discharge.    Final Clinical Impression(s) / ED Diagnoses Final diagnoses:  Dysuria  Penile discharge    Rx / DC Orders ED Discharge Orders          Ordered    doxycycline (VIBRAMYCIN) 100 MG capsule  2 times daily        07/02/22 1120              Aaron, [name] 07/02/22 1158    Rondel Baton, MD 07/03/22 1202

## 2022-07-02 NOTE — Discharge Instructions (Addendum)
We have tested you for STDs today.  These results will not come back until tomorrow or the following day.  You will be notified if you have any positive results, but you should follow these results on your MyChart.  We have treated you prophylactically for both gonorrhea and chlamydia.  I have sent a prescription to your pharmacy that you should take as prescribed.  If you have any other positive results, you will need to follow-up with a medical provider.  Please abstain from sexual activity while your results are in process.  If you have any positive STDs, please refrain from sexual activity for at least 2 weeks until you have been treated and are asymptomatic.

## 2022-07-02 NOTE — ED Triage Notes (Signed)
Pt reports tingling in penis on Monday, burning and greenish penile discharge on wednesday

## 2022-07-03 LAB — RPR: RPR Ser Ql: NONREACTIVE

## 2022-07-05 LAB — GC/CHLAMYDIA PROBE AMP (~~LOC~~) NOT AT ARMC
Chlamydia: POSITIVE — AB
Comment: NEGATIVE
Comment: NORMAL
Neisseria Gonorrhea: POSITIVE — AB

## 2022-07-23 ENCOUNTER — Emergency Department (HOSPITAL_BASED_OUTPATIENT_CLINIC_OR_DEPARTMENT_OTHER)
Admission: EM | Admit: 2022-07-23 | Discharge: 2022-07-23 | Disposition: A | Discharge status: Home / Self Care | Payer: Self-pay | Attending: Emergency Medicine | Admitting: Emergency Medicine

## 2022-07-23 ENCOUNTER — Other Ambulatory Visit: Payer: Self-pay

## 2022-07-23 ENCOUNTER — Other Ambulatory Visit (HOSPITAL_BASED_OUTPATIENT_CLINIC_OR_DEPARTMENT_OTHER): Payer: Self-pay

## 2022-07-23 ENCOUNTER — Encounter (HOSPITAL_BASED_OUTPATIENT_CLINIC_OR_DEPARTMENT_OTHER): Payer: Self-pay | Admitting: Emergency Medicine

## 2022-07-23 DIAGNOSIS — M791 Myalgia, unspecified site: Secondary | ICD-10-CM | POA: Insufficient documentation

## 2022-07-23 DIAGNOSIS — R5383 Other fatigue: Secondary | ICD-10-CM | POA: Insufficient documentation

## 2022-07-23 DIAGNOSIS — J020 Streptococcal pharyngitis: Secondary | ICD-10-CM | POA: Insufficient documentation

## 2022-07-23 DIAGNOSIS — J3489 Other specified disorders of nose and nasal sinuses: Secondary | ICD-10-CM | POA: Insufficient documentation

## 2022-07-23 DIAGNOSIS — R059 Cough, unspecified: Secondary | ICD-10-CM | POA: Insufficient documentation

## 2022-07-23 DIAGNOSIS — Z20822 Contact with and (suspected) exposure to covid-19: Secondary | ICD-10-CM | POA: Insufficient documentation

## 2022-07-23 LAB — GROUP A STREP BY PCR: Group A Strep by PCR: DETECTED — AB

## 2022-07-23 LAB — RESP PANEL BY RT-PCR (FLU A&B, COVID) ARPGX2
Influenza A by PCR: NEGATIVE
Influenza B by PCR: NEGATIVE
SARS Coronavirus 2 by RT PCR: NEGATIVE

## 2022-07-23 MED ORDER — AZITHROMYCIN 500 MG PO TABS: 500.0000 mg | ORAL_TABLET | Freq: Every day | ORAL | 0 refills | Status: DC

## 2022-07-23 MED ORDER — OXYCODONE-ACETAMINOPHEN 5-325 MG PO TABS
1.0000 | ORAL_TABLET | Freq: Once | ORAL | Status: AC
  Administered 2022-07-23: 1 via ORAL
  Filled 2022-07-23: qty 1

## 2022-07-23 MED ORDER — DEXAMETHASONE SODIUM PHOSPHATE 10 MG/ML IJ SOLN
10.0000 mg | Freq: Once | INTRAMUSCULAR | Status: AC
  Administered 2022-07-23: 10 mg via INTRAMUSCULAR
  Filled 2022-07-23: qty 1

## 2022-07-23 MED ORDER — AZITHROMYCIN 500 MG PO TABS
500.0000 mg | ORAL_TABLET | Freq: Every day | ORAL | 0 refills | Status: DC
  Filled 2022-07-23: qty 5, 5d supply, fill #0

## 2022-07-23 NOTE — Discharge Instructions (Addendum)
It was a pleasure taking care of you today!  Your strep test was positive in the ED. Your COVID and flu swab was negative today.  You will be sent a prescription for Zithromax, take as directed.  You may take over-the-counter 600 mg ibuprofen every 6 hours and alternate with 500 mg Tylenol every 6 hours as needed for pain for no more than 7 days.  You may also take over-the-counter Chloraseptic spray.  Ensure to maintain fluid intake with tea, broth, soup, Gatorade, Pedialyte, water.  You may follow-up with your primary care provider as needed.  Return to the emergency department if experiencing increasing/worsening trouble swallowing, trouble breathing, fever, worsening symptoms.

## 2022-07-23 NOTE — ED Triage Notes (Signed)
Sore throat x 1 day , fatigued , body ache.

## 2022-07-23 NOTE — ED Provider Notes (Signed)
[facility] EMERGENCY DEPARTMENT Provider Note   CSN: [id number] Arrival date & time: 07/23/22  1057     History  Chief Complaint  Patient presents with   Sore Throat    Aaron Alvarado is a 26 y.o. male who presents to the emergency department with concerns for sore throat onset yesterday.  Has possible sick contacts through his child and at home.  Has associated fatigue, generalized body aches, cough, rhinorrhea.  Has tried DayQuil and NyQuil with some relief of his symptoms.  Denies nasal congestion, fever.  The history is provided by the patient. No language interpreter was used.       Home Medications Prior to Admission medications   Medication Sig Start Date End Date Taking? Authorizing Provider  azithromycin (ZITHROMAX) 500 MG tablet Take 1 tablet (500 mg total) by mouth daily for 5 days. 07/23/22 07/28/22 Yes Adjoa Althouse A, PA-C      Allergies    Penicillins    Review of Systems   Review of Systems  Constitutional:  Negative for fever.  HENT:  Positive for rhinorrhea and sore throat. Negative for congestion and trouble swallowing.   Respiratory:  Positive for cough.   All other systems reviewed and are negative.   Physical Exam Updated Vital Signs BP (!) 146/91 (BP Location: Left Arm)   Pulse 95   Temp 98.3 F (36.8 C) (Oral)   Resp 20   Ht 5\' 11"  (1.803 m)   Wt 122.5 kg   SpO2 99%   BMI 37.66 kg/m  Physical Exam Vitals and nursing note reviewed.  Constitutional:      General: He is not in acute distress.    Appearance: He is not diaphoretic.  HENT:     Head: Normocephalic and atraumatic.     Right Ear: Tympanic membrane, ear canal and external ear normal.     Left Ear: Tympanic membrane, ear canal and external ear normal.     Mouth/Throat:     Pharynx: No oropharyngeal exudate.  Eyes:     General: No scleral icterus.    Conjunctiva/sclera: Conjunctivae normal.  Cardiovascular:     Rate and Rhythm: Normal rate and regular rhythm.      Pulses: Normal pulses.     Heart sounds: Normal heart sounds.  Pulmonary:     Effort: Pulmonary effort is normal. No respiratory distress.     Breath sounds: Normal breath sounds. No wheezing.  Abdominal:     General: Bowel sounds are normal.     Palpations: Abdomen is soft. There is no mass.     Tenderness: There is no abdominal tenderness. There is no guarding or rebound.  Musculoskeletal:        General: Normal range of motion.     Cervical back: Normal range of motion and neck supple.  Skin:    General: Skin is warm and dry.  Neurological:     Mental Status: He is alert.  Psychiatric:        Behavior: Behavior normal.     ED Results / Procedures / Treatments   Labs (all labs ordered are listed, but only abnormal results are displayed) Labs Reviewed  GROUP A STREP BY PCR - Abnormal; Notable for the following components:      Result Value   Group A Strep by PCR DETECTED (*)    All other components within normal limits  RESP PANEL BY RT-PCR (FLU A&B, COVID) [id number]    EKG None  Radiology No results  found.  Procedures Procedures    Medications Ordered in ED Medications  dexamethasone (DECADRON) injection 10 mg (has no administration in time range)  oxyCODONE-acetaminophen (PERCOCET/ROXICET) 5-325 MG per tablet 1 tablet (has no administration in time range)    ED Course/ Medical Decision Making/ A&P Clinical Course as of 07/23/22 1236  Fri Jul 23, 2022  1225 Group A Strep by PCR(!): DETECTED [SB]  1235 Patient reevaluated and discussed with patient positive strep findings.  Discussed discharge treatment plan.  Patient appears safe for discharge at this time. [SB]    Clinical Course User Index [SB] Ric Rosenberg A, PA-C                           Medical Decision Making Amount and/or Complexity of Data Reviewed Labs:  Decision-making details documented in ED Course.  Risk Prescription drug management.   Pt presents with concerns for sore throat, fatigue,  generalized body aches onset yesterday.  Has tried over-the-counter medication with some relief of symptoms.  Patient afebrile.  On exam patient without acute cardiovascular, respiratory exam findings.  Uvula midline without swelling, patent airway, no tonsillar exudate or posterior pharyngeal erythema noted.  Bilateral TMs unremarkable.  Differential diagnosis includes COVID, flu, strep pharyngitis, viral pharyngitis, viral URI with cough.    Labs:  I ordered, and personally interpreted labs.  The pertinent results include:   COVID swab negative Flu swab negative Strep swab positive   Medications:  I ordered medication including Decadron, Percocet for pain management I have reviewed the patients home medicines and have made adjustments as needed   Disposition: Presentation suspicious for strep pharyngitis.  Doubt viral pharyngitis, COVID, flu, viral URI with cough. After consideration of the diagnostic results and the patients response to treatment, I feel that the patient would benefit from Discharge home.  Patient with a penicillin allergy, will discharge home with prescription for azithromycin.  Supportive care measures and strict return precautions discussed with patient at bedside. Pt acknowledges and verbalizes understanding. Pt appears safe for discharge. Follow up as indicated in discharge paperwork.    This chart was dictated using voice recognition software, Dragon. Despite the best efforts of this provider to proofread and correct errors, errors may still occur which can change documentation meaning.   Final Clinical Impression(s) / ED Diagnoses Final diagnoses:  Strep pharyngitis    Rx / DC Orders ED Discharge Orders          Ordered    azithromycin (ZITHROMAX) 500 MG tablet  Daily        07/23/22 1235              Hydeia Mcatee A, PA-C 07/23/22 1237    Terald Sleeper, MD 07/23/22 1506

## 2022-07-26 ENCOUNTER — Emergency Department (HOSPITAL_BASED_OUTPATIENT_CLINIC_OR_DEPARTMENT_OTHER)
Admission: EM | Admit: 2022-07-26 | Discharge: 2022-07-26 | Disposition: A | Discharge status: Home / Self Care | Payer: Self-pay | Attending: Emergency Medicine | Admitting: Emergency Medicine

## 2022-07-26 ENCOUNTER — Other Ambulatory Visit: Payer: Self-pay

## 2022-07-26 ENCOUNTER — Encounter (HOSPITAL_BASED_OUTPATIENT_CLINIC_OR_DEPARTMENT_OTHER): Payer: Self-pay | Admitting: Emergency Medicine

## 2022-07-26 DIAGNOSIS — J020 Streptococcal pharyngitis: Secondary | ICD-10-CM | POA: Insufficient documentation

## 2022-07-26 MED ORDER — SODIUM CHLORIDE 0.9 % IV BOLUS
1000.0000 mL | Freq: Once | INTRAVENOUS | Status: AC
  Administered 2022-07-26: 1000 mL via INTRAVENOUS

## 2022-07-26 MED ORDER — KETOROLAC TROMETHAMINE 15 MG/ML IJ SOLN
15.0000 mg | Freq: Once | INTRAMUSCULAR | Status: AC
  Administered 2022-07-26: 15 mg via INTRAVENOUS
  Filled 2022-07-26: qty 1

## 2022-07-26 MED ORDER — CLINDAMYCIN HCL 150 MG PO CAPS: 300.0000 mg | ORAL_CAPSULE | Freq: Four times a day (QID) | ORAL | 0 refills | Status: DC

## 2022-07-26 MED ORDER — DEXAMETHASONE SODIUM PHOSPHATE 10 MG/ML IJ SOLN
10.0000 mg | Freq: Once | INTRAMUSCULAR | Status: AC
  Administered 2022-07-26: 10 mg via INTRAVENOUS
  Filled 2022-07-26: qty 1

## 2022-07-26 NOTE — ED Notes (Signed)
Discharge paperwork given and verbally understood. 

## 2022-07-26 NOTE — Discharge Instructions (Signed)
Please read and follow all provided instructions.  Your diagnoses today include:  1. Acute streptococcal pharyngitis    Tests performed today include: Vital signs. See below for your results today.   Medications prescribed:  Clindamycin - antibiotic  Fill this medication tomorrow if you continue to feel bad.   Ibuprofen (Motrin, Advil) - anti-inflammatory pain medication Do not exceed 800mg  ibuprofen every 8 hours, take with food  You have been prescribed an anti-inflammatory medication or NSAID. Take with food. Take smallest effective dose for the shortest duration needed for your pain. Stop taking if you experience stomach pain or vomiting.   Take any medications prescribed only as directed.   Home care instructions:  Please read the educational materials provided and follow any instructions contained in this packet.  Follow-up instructions: Please follow-up with your primary care provider as needed for further evaluation of your symptoms.  Return instructions:  Please return to the Emergency Department if you experience worsening symptoms.  Return if you have worsening problems swallowing, your neck becomes swollen, you cannot swallow your saliva or your voice becomes muffled.  Return with high persistent fever, persistent vomiting, or if you have trouble breathing.  Please return if you have any other emergent concerns.  Additional Information:  Your vital signs today were: BP (!) 145/93   Pulse 89   Temp 99.6 F (37.6 C)   Resp 18   Ht 5\' 11"  (1.803 m)   Wt 122.5 kg   SpO2 100%   BMI 37.66 kg/m  If your blood pressure (BP) was elevated above 135/85 this visit, please have this repeated by your doctor within one month. --------------

## 2022-07-26 NOTE — ED Provider Notes (Signed)
[facility] EMERGENCY DEPT Provider Note   CSN: [id number] Arrival date & time: 07/26/22  1248     History  Chief Complaint  Patient presents with   Sore Throat    Aaron Alvarado is a 26 y.o. male.  Patient presents to the emergency department today for evaluation of ongoing sore throat.  Patient was seen in the emergency department on 07/23/22 for sore throat and diagnosed with strep throat.  He was treated with IM Decadron, started on azithromycin.  He states that initially he felt better, however after going home his sore throat returned.  He is able to swallow but states that it is uncomfortable due to pain.  He took Tylenol this morning which helped temporarily, however symptoms got worse again.  He has she had chills but no fevers.  He is able to move his neck well.  No cough.  Pain radiates to his ears bilaterally.  Also reports swelling underneath of his neck.       Home Medications Prior to Admission medications   Medication Sig Start Date End Date Taking? Authorizing Provider  azithromycin (ZITHROMAX) 500 MG tablet Take 1 tablet (500 mg total) by mouth daily for 5 days. 07/23/22 07/28/22  Blue, Soijett A, PA-C      Allergies    Penicillins    Review of Systems   Review of Systems  Physical Exam Updated Vital Signs BP (!) 145/93   Pulse 89   Temp 99.6 F (37.6 C)   Resp 18   Ht 5\' 11"  (1.803 m)   Wt 122.5 kg   SpO2 100%   BMI 37.66 kg/m   Physical Exam Vitals and nursing note reviewed.  Constitutional:      Appearance: He is well-developed.  HENT:     Head: Normocephalic and atraumatic.     Jaw: No trismus.     Right Ear: Tympanic membrane, ear canal and external ear normal.     Left Ear: Tympanic membrane, ear canal and external ear normal.     Nose: Nose normal. No mucosal edema or rhinorrhea.     Mouth/Throat:     Mouth: Mucous membranes are moist. Mucous membranes are not dry.     Pharynx: Oropharynx is clear. Uvula midline. Posterior  oropharyngeal erythema present. No oropharyngeal exudate or uvula swelling.     Tonsils: No tonsillar abscesses.     Comments: No clinical signs of peritonsillar abscess.  No uvular deviation or or peritonsillar fullness.  Voice is not muffled. Eyes:     General:        Right eye: No discharge.        Left eye: No discharge.     Conjunctiva/sclera: Conjunctivae normal.  Cardiovascular:     Rate and Rhythm: Normal rate and regular rhythm.     Heart sounds: Normal heart sounds.  Pulmonary:     Effort: Pulmonary effort is normal. No respiratory distress.     Breath sounds: Normal breath sounds. No wheezing or rales.  Abdominal:     Palpations: Abdomen is soft.     Tenderness: There is no abdominal tenderness.  Musculoskeletal:     Cervical back: Normal range of motion and neck supple.  Skin:    General: Skin is warm and dry.  Neurological:     Mental Status: He is alert.     ED Results / Procedures / Treatments   Labs (all labs ordered are listed, but only abnormal results are displayed) Labs Reviewed - No  data to display  EKG None  Radiology No results found.  Procedures Procedures    Medications Ordered in ED Medications  sodium chloride 0.9 % bolus 1,000 mL (0 mLs Intravenous Stopped 07/26/22 1701)  ketorolac (TORADOL) 15 MG/ML injection 15 mg (15 mg Intravenous Given 07/26/22 1537)  dexamethasone (DECADRON) injection 10 mg (10 mg Intravenous Given 07/26/22 1537)    ED Course/ Medical Decision Making/ A&P    Patient seen and examined. History obtained directly from patient.  Reviewed previous notes from ED visit on the eighth.  Labs/EKG: None ordered  Imaging: None ordered  Medications/Fluids: IV fluid bolus, IV Toradol, IV Decadron  Most recent vital signs reviewed and are as follows: BP (!) 145/93   Pulse 89   Temp 99.6 F (37.6 C)   Resp 18   Ht 5\' 11"  (1.803 m)   Wt 122.5 kg   SpO2 100%   BMI 37.66 kg/m   Initial impression: Strep throat, will  treat symptomatically.  No signs of peritonsillar abscess.  Discussed with patient that we could switch antibiotics from azithromycin to clindamycin.  He would like to do this.  5:15 PM Reassessment performed. Patient appears more comfortable.  States that his throat is feeling better.  IV fluids completed.  Comfortable with discharged home at this time.    Reviewed pertinent lab work and imaging with patient at bedside. Questions answered.   Most current vital signs reviewed and are as follows: BP 128/83 (BP Location: Left Arm)   Pulse 68   Temp 98.6 F (37 C) (Oral)   Resp 18   Ht 5\' 11"  (1.803 m)   Wt 122.5 kg   SpO2 99%   BMI 37.66 kg/m   Plan: Discharge to home.   Prescriptions written for: Clindamycin  Other home care instructions discussed: Ibuprofen 800 mg every 8 hours with food, also Tylenol as directed on packaging  ED return instructions discussed: Worsening pain, high fever, inability to swallow  Follow-up instructions discussed: Patient encouraged to follow-up with their PCP as needed.                           Medical Decision Making Risk Prescription drug management.   In regards to the patient's sore throat today, the following dangerous and potentially life threatening etiologies were considered on the differential diagnosis: Lugwig's angina, uvulitis, epiglottis, peritonsillar abscess, retropharyngeal abscess, Lemierre's syndrome. Also considered were more common causes such as: streptococcal pharyngitis, gonococcal pharyngitis, non-bacterial pharyngitis (cold viruses, HSV/coxsackievirus, influenza, COVID-19, infectious mononucleosis, oropharyngeal candidiasis), and other non-infectious causes including seasonal allergies/post-nasal drip, GERD/esophagitis, trauma.   The patient's vital signs, pertinent lab work and imaging were reviewed and interpreted as discussed in the ED course. Hospitalization was considered for further testing, treatments, or serial  exams/observation. However as patient is well-appearing, has a stable exam, and reassuring studies today, I do not feel that they warrant admission at this time. This plan was discussed with the patient who verbalizes agreement and comfort with this plan and seems reliable and able to return to the Emergency Department with worsening or changing symptoms.          Final Clinical Impression(s) / ED Diagnoses Final diagnoses:  Acute streptococcal pharyngitis    Rx / DC Orders ED Discharge Orders          Ordered    clindamycin (CLEOCIN) 150 MG capsule  Every 6 hours        07/26/22 1713  Renne Crigler, PA-C 07/26/22 1719    Gloris Manchester, MD 07/27/22 [phone]

## 2022-07-26 NOTE — ED Triage Notes (Signed)
Pt arrived POV from home. Pt reports he was diagnosed with strept throat on Friday and was given a steroid shot, prescribed antibiotic that he has finished but has not gotten any better. Pt c/o sore throat and diarrhea. Pt took tramadol this morning which relieved pain.

## 2022-07-29 ENCOUNTER — Other Ambulatory Visit: Payer: Self-pay

## 2022-07-29 ENCOUNTER — Emergency Department (HOSPITAL_BASED_OUTPATIENT_CLINIC_OR_DEPARTMENT_OTHER)
Admission: EM | Admit: 2022-07-29 | Discharge: 2022-07-30 | Disposition: A | Discharge status: Home / Self Care | Payer: Self-pay | Attending: Emergency Medicine | Admitting: Emergency Medicine

## 2022-07-29 ENCOUNTER — Emergency Department (HOSPITAL_BASED_OUTPATIENT_CLINIC_OR_DEPARTMENT_OTHER): Payer: Self-pay

## 2022-07-29 ENCOUNTER — Encounter (HOSPITAL_BASED_OUTPATIENT_CLINIC_OR_DEPARTMENT_OTHER): Payer: Self-pay

## 2022-07-29 DIAGNOSIS — J36 Peritonsillar abscess: Secondary | ICD-10-CM | POA: Insufficient documentation

## 2022-07-29 LAB — CBC WITH DIFFERENTIAL/PLATELET
Abs Immature Granulocytes: 0.08 10*3/uL — ABNORMAL HIGH (ref 0.00–0.07)
Basophils Absolute: 0.1 10*3/uL (ref 0.0–0.1)
Basophils Relative: 1 %
Eosinophils Absolute: 0.3 10*3/uL (ref 0.0–0.5)
Eosinophils Relative: 2 %
HCT: 41.7 % (ref 39.0–52.0)
Hemoglobin: 13.8 g/dL (ref 13.0–17.0)
Immature Granulocytes: 1 %
Lymphocytes Relative: 30 %
Lymphs Abs: 3.4 10*3/uL (ref 0.7–4.0)
MCH: 27.4 pg (ref 26.0–34.0)
MCHC: 33.1 g/dL (ref 30.0–36.0)
MCV: 82.7 fL (ref 80.0–100.0)
Monocytes Absolute: 1 10*3/uL (ref 0.1–1.0)
Monocytes Relative: 9 %
Neutro Abs: 6.3 10*3/uL (ref 1.7–7.7)
Neutrophils Relative %: 57 %
Platelets: 335 10*3/uL (ref 150–400)
RBC: 5.04 MIL/uL (ref 4.22–5.81)
RDW: 12.3 % (ref 11.5–15.5)
WBC: 11.2 10*3/uL — ABNORMAL HIGH (ref 4.0–10.5)
nRBC: 0 % (ref 0.0–0.2)

## 2022-07-29 LAB — BASIC METABOLIC PANEL
Anion gap: 11 (ref 5–15)
BUN: 14 mg/dL (ref 6–20)
CO2: 26 mmol/L (ref 22–32)
Calcium: 9.4 mg/dL (ref 8.9–10.3)
Chloride: 99 mmol/L (ref 98–111)
Creatinine, Ser: 0.91 mg/dL (ref 0.61–1.24)
GFR, Estimated: >60 mL/min (ref >60)
Glucose, Bld: 87 mg/dL (ref 70–99)
Potassium: 3.4 mmol/L — ABNORMAL LOW (ref 3.5–5.1)
Sodium: 136 mmol/L (ref 135–145)

## 2022-07-29 MED ORDER — ONDANSETRON HCL 4 MG/2ML IJ SOLN
4.0000 mg | Freq: Once | INTRAMUSCULAR | Status: AC
  Administered 2022-07-29: 4 mg via INTRAVENOUS
  Filled 2022-07-29: qty 2

## 2022-07-29 MED ORDER — HYDROMORPHONE HCL 1 MG/ML IJ SOLN
1.0000 mg | Freq: Once | INTRAMUSCULAR | Status: AC
  Administered 2022-07-29: 1 mg via INTRAVENOUS
  Filled 2022-07-29: qty 1

## 2022-07-29 MED ORDER — IOHEXOL 300 MG/ML  SOLN
75.0000 mL | Freq: Once | INTRAMUSCULAR | Status: AC | PRN
  Administered 2022-07-29: 75 mL via INTRAVENOUS

## 2022-07-29 MED ORDER — SODIUM CHLORIDE 0.9 % IV BOLUS
1000.0000 mL | Freq: Once | INTRAVENOUS | Status: AC
  Administered 2022-07-29: 1000 mL via INTRAVENOUS

## 2022-07-29 NOTE — ED Provider Notes (Signed)
Blood pressure (!) 167/99, pulse 66, temperature 99.9 F (37.7 C), resp. rate 20, SpO2 100 %.  Assuming care from Dr. Rubin Payor.  In short, Aaron Alvarado is a 26 y.o. male with a chief complaint of Sore Throat .  Refer to the original H&P for additional details.  The current plan of care is to follow up on CT.  12:00 AM Spoke with Dr. Jearld Fenton regarding CT neck showing peritonsillar abscess. Would like patient transferred to [facility] for drainage. Mom to take POV. Dr. Bebe Shaggy is the accepting EDP.     Maia Plan, MD 07/30/22 0005

## 2022-07-29 NOTE — ED Notes (Signed)
Pt continues to c/o pain, few ice chips given and pt is in tears because he can't swallow

## 2022-07-29 NOTE — ED Triage Notes (Signed)
Pt reports recently being diagnosed with strept throat and started on Clindamycin on 07/26/22.   Since then has had worsening sore throat, painful when talking and swallowing and subjective throat swelling.

## 2022-07-29 NOTE — ED Provider Notes (Signed)
[facility] EMERGENCY DEPT Provider Note   CSN: [id number] Arrival date & time: 07/29/22  1942     History {Add pertinent medical, surgical, social history, OB history to HPI:1} Chief Complaint  Patient presents with   Sore Throat    CAIDON FOTI is a 26 y.o. male.   Sore Throat  Patient presents with sore throat.  On the eighth had a positive strep test.  Has had sore throat.  Been on clindamycin.  Had worsening pain and was seen on the 11th and given Decadron.  Also switched to azithromycin.  Has had continued worsening of the pain.  Difficulty swallowing and some difficulty speaking.  Will occasionally spit out some sputum.  States his voice is changed.  Continued pain unrelieved by medicines at home.     Home Medications Prior to Admission medications   Medication Sig Start Date End Date Taking? Authorizing Provider  clindamycin (CLEOCIN) 150 MG capsule Take 2 capsules (300 mg total) by mouth every 6 (six) hours. 07/26/22   Renne Crigler, PA-C      Allergies    Penicillins    Review of Systems   Review of Systems  Physical Exam Updated Vital Signs BP (!) 158/103 (BP Location: Right Arm)   Pulse 70   Temp 98.7 F (37.1 C)   Resp 16   SpO2 99%  Physical Exam Vitals reviewed.  HENT:     Head: Atraumatic.     Mouth/Throat:     Comments: Bilateral tonsillar swelling.  Almost kissing tonsils.  No definite peritonsillar abscess.  Patient does have an emesis bag and is putting some spit into it. Cardiovascular:     Rate and Rhythm: Normal rate and regular rhythm.  Pulmonary:     Breath sounds: No wheezing or rhonchi.  Abdominal:     Tenderness: There is no abdominal tenderness.  Skin:    General: Skin is warm.     Capillary Refill: Capillary refill takes less than 2 seconds.  Neurological:     Mental Status: He is alert.     ED Results / Procedures / Treatments   Labs (all labs ordered are listed, but only abnormal results are  displayed) Labs Reviewed  CBC WITH DIFFERENTIAL/PLATELET - Abnormal; Notable for the following components:      Result Value   WBC 11.2 (*)    Abs Immature Granulocytes 0.08 (*)    All other components within normal limits  BASIC METABOLIC PANEL - Abnormal; Notable for the following components:   Potassium 3.4 (*)    All other components within normal limits    EKG None  Radiology No results found.  Procedures Procedures  {Document cardiac monitor, telemetry assessment procedure when appropriate:1}  Medications Ordered in ED Medications  sodium chloride 0.9 % bolus 1,000 mL (1,000 mLs Intravenous Incomplete 07/29/22 2143)  HYDROmorphone (DILAUDID) injection 1 mg (1 mg Intravenous Given 07/29/22 2143)  ondansetron (ZOFRAN) injection 4 mg (4 mg Intravenous Given 07/29/22 2142)  iohexol (OMNIPAQUE) 300 MG/ML solution 75 mL (75 mLs Intravenous Contrast Given 07/29/22 2251)    ED Course/ Medical Decision Making/ A&P                           Medical Decision Making Amount and/or Complexity of Data Reviewed Labs: ordered. Radiology: ordered.  Risk Prescription drug management.   Patient with sore throat.  Positive strep test.  Continued pain.  Has had Decadron prior to this.  Is  on azithromycin.  Difficulty swallowing.  Will get CT scan to evaluate. Differential diagnosis includes strep pharyngitis, peritonsillar abscess.  {Document critical care time when appropriate:1} {Document review of labs and clinical decision tools ie heart score, Chads2Vasc2 etc:1}  {Document your independent review of radiology images, and any outside records:1} {Document your discussion with family members, caretakers, and with consultants:1} {Document social determinants of health affecting pt's care:1} {Document your decision making why or why not admission, treatments were needed:1} Final Clinical Impression(s) / ED Diagnoses Final diagnoses:  None    Rx / DC Orders ED Discharge Orders      None

## 2022-07-30 MED ORDER — KETOROLAC TROMETHAMINE 30 MG/ML IJ SOLN
30.0000 mg | Freq: Once | INTRAMUSCULAR | Status: AC
  Administered 2022-07-30: 30 mg via INTRAVENOUS
  Filled 2022-07-30: qty 1

## 2022-07-30 MED ORDER — OXYCODONE-ACETAMINOPHEN 5-325 MG PO TABS
1.0000 | ORAL_TABLET | Freq: Once | ORAL | Status: AC
  Administered 2022-07-30: 1 via ORAL
  Filled 2022-07-30: qty 1

## 2022-07-30 NOTE — ED Notes (Signed)
Pt will be going by POV to [facility] Paperwork sealed and given to pt to give to ED staff there

## 2022-07-30 NOTE — ED Notes (Signed)
PATIENT LEFT AMA 

## 2022-12-24 ENCOUNTER — Other Ambulatory Visit: Payer: Self-pay

## 2022-12-24 ENCOUNTER — Encounter (HOSPITAL_BASED_OUTPATIENT_CLINIC_OR_DEPARTMENT_OTHER): Payer: Self-pay

## 2022-12-24 ENCOUNTER — Emergency Department (HOSPITAL_BASED_OUTPATIENT_CLINIC_OR_DEPARTMENT_OTHER)
Admission: EM | Admit: 2022-12-24 | Discharge: 2022-12-24 | Disposition: A | Discharge status: Home / Self Care | Payer: BC Managed Care – PPO | Attending: Emergency Medicine | Admitting: Emergency Medicine

## 2022-12-24 DIAGNOSIS — J029 Acute pharyngitis, unspecified: Secondary | ICD-10-CM | POA: Diagnosis present

## 2022-12-24 DIAGNOSIS — Z1152 Encounter for screening for COVID-19: Secondary | ICD-10-CM | POA: Insufficient documentation

## 2022-12-24 DIAGNOSIS — R030 Elevated blood-pressure reading, without diagnosis of hypertension: Secondary | ICD-10-CM | POA: Insufficient documentation

## 2022-12-24 DIAGNOSIS — J039 Acute tonsillitis, unspecified: Secondary | ICD-10-CM | POA: Diagnosis not present

## 2022-12-24 DIAGNOSIS — H65111 Acute and subacute allergic otitis media (mucoid) (sanguinous) (serous), right ear: Secondary | ICD-10-CM | POA: Diagnosis not present

## 2022-12-24 LAB — RESP PANEL BY RT-PCR (RSV, FLU A&B, COVID)  RVPGX2
Influenza A by PCR: NEGATIVE
Influenza B by PCR: NEGATIVE
Resp Syncytial Virus by PCR: NEGATIVE
SARS Coronavirus 2 by RT PCR: NEGATIVE

## 2022-12-24 LAB — GROUP A STREP BY PCR: Group A Strep by PCR: NOT DETECTED

## 2022-12-24 MED ORDER — AMOXICILLIN-POT CLAVULANATE 875-125 MG PO TABS: 1.0000 | ORAL_TABLET | Freq: Two times a day (BID) | ORAL | 0 refills | Status: DC

## 2022-12-24 MED ORDER — IBUPROFEN 600 MG PO TABS: 600.0000 mg | ORAL_TABLET | Freq: Four times a day (QID) | ORAL | 0 refills | Status: DC | PRN

## 2022-12-24 MED ORDER — CLINDAMYCIN HCL 150 MG PO CAPS: 300.0000 mg | ORAL_CAPSULE | Freq: Three times a day (TID) | ORAL | 0 refills | Status: DC

## 2022-12-24 MED ORDER — IBUPROFEN 400 MG PO TABS
600.0000 mg | ORAL_TABLET | Freq: Once | ORAL | Status: AC
  Administered 2022-12-24: 600 mg via ORAL
  Filled 2022-12-24: qty 1

## 2022-12-24 MED ORDER — DEXAMETHASONE 4 MG PO TABS
10.0000 mg | ORAL_TABLET | Freq: Once | ORAL | Status: AC
  Administered 2022-12-24: 10 mg via ORAL
  Filled 2022-12-24: qty 3

## 2022-12-24 NOTE — Discharge Instructions (Addendum)
You were seen in the ER today for evaluation of your sore throat.  You tested negative for COVID, flu, RSV, and strep.  Given your tonsils appearance, likely was his tonsillitis which I will still give you treatment for given your history of peritonsillar abscess.  I have prescribed you Augmentin.  You are to take this 2 times a day for the next 10 days for treatment.  I have also given you a one-time steroid dose here which should help with some of the swelling.  I would like you to take at 1000 g of Tylenol and/or 600 mg of ibuprofen every 6 hours.  Please make sure you are mouth and throat are staying well-lubricated with things such as hard candies, throat lozenges, and staying well-hydrated drinking plenty of fluids, mainly water.  If you have any concerns, new or worsening symptoms, please return to the nearest Emergency Department for evaluation.  Contact a health care provider if: You notice large, tender lumps in your neck that were not there before. You have a fever that does not go away after 2-3 days. You develop a rash. You cough up a green, yellow-brown, or bloody substance. You cannot swallow liquids or food for 24 hours. Only one of your tonsils is swollen. Get help right away if: You develop any new symptoms, such as vomiting, severe headache, stiff neck, chest pain, trouble breathing, or trouble swallowing. You have severe throat pain along with drooling or voice changes. You have severe pain that is not controlled with medicines. You cannot fully open your mouth. You develop redness, swelling, or severe pain anywhere in your neck.

## 2022-12-24 NOTE — ED Notes (Signed)
RN provided AVS using Teachback Method. Patient verbalizes understanding of Discharge Instructions. Opportunity for Questioning and Answers were provided by RN. Patient Discharged from ED ambulatory to Home via Self.  

## 2022-12-24 NOTE — ED Triage Notes (Signed)
Pt reports sore throat that started on Monday. He states he has been taking an old prescription of antibiotics but it hasn't helped. Hx of peritonsillar abscess and this feels similar. Tonsils appear enlarged and red.

## 2022-12-24 NOTE — ED Provider Notes (Signed)
[facility] Provider Note   CSN: [id number] Arrival date & time: 12/24/22  1723     History Chief Complaint  Patient presents with   Sore Throat    Aaron Alvarado is a 27 y.o. male with history of seasonal allergies presents the emergency department today for evaluation of sore throat for the past 5 days.  He reports he been taking some leftover antibiotics of unknown type from the last time he had strep.  He denies any fever, nausea, vomiting, shortness of breath, chest pain, rashes, joint swelling, dark urine, vomiting, dysuria.  He does report he is been having some right ear pain as well.  Is been trying some Tylenol for pain, last dose was around 0600 today.  He does have a history of peritonsillar abscess and had 1 in September.  He was concerned as this may have come back.  He takes Human resources officer.  He is allergic to penicillin however he was on Augmentin previously and did not have any reaction.  He reports that his allergy to penicillin was in childhood.  Denies any tobacco, EtOH, licit drug use ever.   Sore Throat Pertinent negatives include no chest pain, no abdominal pain and no shortness of breath.       Home Medications Prior to Admission medications   Medication Sig Start Date End Date Taking? Authorizing Provider  clindamycin (CLEOCIN) 150 MG capsule Take 2 capsules (300 mg total) by mouth every 6 (six) hours. 07/26/22   Carlisle Cater, PA-C      Allergies    Penicillins    Review of Systems   Review of Systems  Constitutional:  Negative for chills and fever.  HENT:  Positive for ear pain and sore throat. Negative for congestion, hearing loss, rhinorrhea and trouble swallowing.   Respiratory:  Negative for cough and shortness of breath.   Cardiovascular:  Negative for chest pain.  Gastrointestinal:  Negative for abdominal pain, diarrhea, nausea and vomiting.  Genitourinary:  Negative for dysuria and hematuria.   Musculoskeletal:  Negative for joint swelling.  Skin:  Negative for rash.    Physical Exam Updated Vital Signs BP (!) 154/98   Pulse 84   Temp 98.8 F (37.1 C) (Oral)   Resp 17   Ht 5' 11"$  (1.803 m)   Wt 122.5 kg   SpO2 98%   BMI 37.66 kg/m  Physical Exam Vitals and nursing note reviewed.  Constitutional:      General: He is not in acute distress.    Appearance: He is not ill-appearing or toxic-appearing.     Comments: On phone in no acute distress.  Normal speech.  HENT:     Left Ear: Tympanic membrane and ear canal normal.     Ears:     Comments: Purulent slightly bulging right TM.  No mastoid tenderness or swelling bilaterally.  No overlying skin changes noted to the mastoid.  External ear canals are well-appearing.    Mouth/Throat:     Mouth: Mucous membranes are moist.     Comments: Moist mucous membranes.  Uvula is midline.  No soft palate deviation.  His tonsils are 2+ bilaterally.  Mildly erythematous with few scant exudates.  He has no trismus.  Controlling his secretions.  Normal speech.  No sublingual elevation. Cardiovascular:     Rate and Rhythm: Normal rate.  Pulmonary:     Effort: Pulmonary effort is normal. No respiratory distress.     Breath sounds: Normal breath sounds.  No stridor.  Musculoskeletal:     Cervical back: Normal range of motion.  Lymphadenopathy:     Cervical: No cervical adenopathy.  Skin:    General: Skin is warm and dry.  Neurological:     General: No focal deficit present.     Mental Status: He is alert.     ED Results / Procedures / Treatments   Labs (all labs ordered are listed, but only abnormal results are displayed) Labs Reviewed  GROUP A STREP BY PCR  RESP PANEL BY RT-PCR (RSV, FLU A&B, COVID)  [id number]    EKG None  Radiology No results found.  Procedures Procedures   Medications Ordered in ED Medications  ibuprofen (ADVIL) tablet 600 mg (600 mg Oral Given 12/24/22 1805)    ED Course/ Medical Decision Making/  A&P                           Medical Decision Making Risk Prescription drug management.   27 year old male presents emerged from today for evaluation of sore throat past few days.  Differential diagnosis includes is limited to strep throat, pharyngitis, peritonsillar abscess, mono, retropharyngeal abscess, viral illness, COVID, flu, RSV, GERD.  Vital signs show mildly elevated blood pressure 130/94, afebrile, normal pulse rate, satting well room air without increased breathing.  Physical exam as noted above.  Patient's tonsils are slightly edematous and erythematous however there is symmetric with a midline uvula.  I doubt any peritonsillar abscess at this time.  He is full range of motion of his neck.  Afebrile here.  Will order swabs.  I independently reviewed and interpreted the patient's labs.  COVID, flu, RSV, strep negative.  Given the patient's history of tonsillitis turning into peritonsillar abscess, and with his right ear infection, I am going to place the patient on Augmentin.  He had Augmentin recently and did not have any reaction or rash or swelling to it.  He reports that his allergy to penicillin was in his childhood.  I plan to proceed with giving him Augmentin with strict precautions.  This will treat the otitis media as well as the strep throat.  No signs of any peritonsillar abscess.  I do not suspect any any retropharyngeal abscess.  Negative for any COVID, flu, RSV, or strep.  I likely think this is tonsillitis.  Will prescribe him some Augmentin for the otitis media of the right to help with the strep and ear infection.  I also gave him a referral for ear nose and throat doctor.  We discussed pain management.  Discussed keeping his throat well lubricated with cough drops and drinking plenty of fluids.  We discussed return precautions and red flag symptoms.  Patient verbalizes understanding and agrees to the plan.  Patient is stable being discharged home in good  condition.   Final Clinical Impression(s) / ED Diagnoses Final diagnoses:  Tonsillitis  Non-recurrent acute allergic otitis media of right ear    Rx / DC Orders ED Discharge Orders          Ordered    clindamycin (CLEOCIN) 150 MG capsule  3 times daily,   Status:  Discontinued        12/24/22 2018    amoxicillin-clavulanate (AUGMENTIN) 875-125 MG tablet  Every 12 hours        12/24/22 2032    ibuprofen (ADVIL) 600 MG tablet  Every 6 hours PRN        12/24/22 2032  Sherrell Puller, PA-C 12/24/22 2126    Drenda Freeze, MD 12/24/22 [phone]

## 2023-02-22 ENCOUNTER — Other Ambulatory Visit: Payer: Self-pay

## 2023-02-22 ENCOUNTER — Emergency Department (HOSPITAL_BASED_OUTPATIENT_CLINIC_OR_DEPARTMENT_OTHER)
Admission: EM | Admit: 2023-02-22 | Discharge: 2023-02-22 | Disposition: A | Discharge status: Home / Self Care | Payer: BC Managed Care – PPO | Attending: Emergency Medicine | Admitting: Emergency Medicine

## 2023-02-22 ENCOUNTER — Encounter (HOSPITAL_BASED_OUTPATIENT_CLINIC_OR_DEPARTMENT_OTHER): Payer: Self-pay | Admitting: Emergency Medicine

## 2023-02-22 DIAGNOSIS — Z202 Contact with and (suspected) exposure to infections with a predominantly sexual mode of transmission: Secondary | ICD-10-CM | POA: Diagnosis present

## 2023-02-22 DIAGNOSIS — N342 Other urethritis: Secondary | ICD-10-CM

## 2023-02-22 DIAGNOSIS — N341 Nonspecific urethritis: Secondary | ICD-10-CM | POA: Diagnosis not present

## 2023-02-22 LAB — URINALYSIS, MICROSCOPIC (REFLEX)

## 2023-02-22 LAB — URINALYSIS, ROUTINE W REFLEX MICROSCOPIC
Bilirubin Urine: NEGATIVE
Glucose, UA: NEGATIVE mg/dL
Hgb urine dipstick: NEGATIVE
Ketones, ur: NEGATIVE mg/dL
Leukocytes,Ua: NEGATIVE
Nitrite: NEGATIVE
Protein, ur: 30 mg/dL — AB
Specific Gravity, Urine: >=1.03 (ref 1.005–1.030)
pH: 5.5 (ref 5.0–8.0)

## 2023-02-22 LAB — HIV ANTIBODY (ROUTINE TESTING W REFLEX): HIV Screen 4th Generation wRfx: NONREACTIVE

## 2023-02-22 MED ORDER — DOXYCYCLINE HYCLATE 100 MG PO CAPS: 100.0000 mg | ORAL_CAPSULE | Freq: Two times a day (BID) | ORAL | 0 refills | Status: DC

## 2023-02-22 MED ORDER — CEFTRIAXONE SODIUM 500 MG IJ SOLR
500.0000 mg | Freq: Once | INTRAMUSCULAR | Status: AC
  Administered 2023-02-22: 500 mg via INTRAMUSCULAR
  Filled 2023-02-22: qty 500

## 2023-02-22 NOTE — ED Provider Notes (Signed)
[place] EMERGENCY DEPARTMENT AT [facility] Provider Note   CSN: [id number] Arrival date & time: 02/22/23  0912     History  Chief Complaint  Patient presents with   SEXUALLY TRANSMITTED DISEASE    Aaron Alvarado is a 27 y.o. male.  Patient is a 27 year old male who presents with concern for an STD.  He started having some tingling when he urinated and in his penis yesterday and noticed some penile discharge that was white today.  He has had gonorrhea and chlamydia last summer.  He does admit to sexual contacts with male partners, both anal and oral.  No abdominal pain.  No nausea or vomiting.  No fevers.  No testicular pain.       Home Medications Prior to Admission medications   Medication Sig Start Date End Date Taking? Authorizing Provider  doxycycline (VIBRAMYCIN) 100 MG capsule Take 1 capsule (100 mg total) by mouth 2 (two) times daily. One po bid x 7 days 02/22/23  Yes Rolan Bucco, MD  ibuprofen (ADVIL) 600 MG tablet Take 1 tablet (600 mg total) by mouth every 6 (six) hours as needed. 12/24/22   Achille Rich, PA-C      Allergies    Penicillins    Review of Systems   Review of Systems  Constitutional:  Negative for chills, diaphoresis, fatigue and fever.  HENT:  Negative for congestion, rhinorrhea and sneezing.   Eyes: Negative.   Respiratory:  Negative for cough, chest tightness and shortness of breath.   Cardiovascular:  Negative for chest pain and leg swelling.  Gastrointestinal:  Negative for abdominal pain, blood in stool, diarrhea, nausea and vomiting.  Genitourinary:  Positive for penile discharge. Negative for difficulty urinating, flank pain, frequency and hematuria.  Musculoskeletal:  Negative for arthralgias and back pain.  Skin:  Negative for rash.  Neurological:  Negative for dizziness, speech difficulty, weakness, numbness and headaches.    Physical Exam Updated Vital Signs BP (!) 142/95 (BP Location: Right Arm)   Pulse 75   Temp  97.6 F (36.4 C) (Oral)   Resp 17   Ht 6' (1.829 m)   Wt (!) 137.4 kg   SpO2 99%   BMI 41.09 kg/m  Physical Exam Constitutional:      Appearance: He is well-developed.  HENT:     Head: Normocephalic and atraumatic.  Eyes:     Pupils: Pupils are equal, round, and reactive to light.  Cardiovascular:     Rate and Rhythm: Normal rate and regular rhythm.     Heart sounds: Normal heart sounds.  Pulmonary:     Effort: Pulmonary effort is normal. No respiratory distress.     Breath sounds: Normal breath sounds. No wheezing or rales.  Chest:     Chest wall: No tenderness.  Abdominal:     General: Bowel sounds are normal.     Palpations: Abdomen is soft.     Tenderness: There is no abdominal tenderness. There is no guarding or rebound.  Genitourinary:    Comments: Normal-appearing external male genitalia, no lesions, no rashes, no discharge noted, no testicular tenderness Musculoskeletal:        General: Normal range of motion.     Cervical back: Normal range of motion and neck supple.  Lymphadenopathy:     Cervical: No cervical adenopathy.  Skin:    General: Skin is warm and dry.     Findings: No rash.  Neurological:     Mental Status: He is alert and  oriented to person, place, and time.     ED Results / Procedures / Treatments   Labs (all labs ordered are listed, but only abnormal results are displayed) Labs Reviewed  URINALYSIS, ROUTINE W REFLEX MICROSCOPIC - Abnormal; Notable for the following components:      Result Value   Protein, ur 30 (*)    All other components within normal limits  URINALYSIS, MICROSCOPIC (REFLEX) - Abnormal; Notable for the following components:   Bacteria, UA FEW (*)    All other components within normal limits  RPR  HIV ANTIBODY (ROUTINE TESTING W REFLEX)  GC/CHLAMYDIA PROBE AMP ([place]) NOT AT [facility]    EKG None  Radiology No results found.  Procedures Procedures    Medications Ordered in ED Medications  cefTRIAXone  (ROCEPHIN) injection 500 mg (500 mg Intramuscular Given 02/22/23 2992)    ED Course/ Medical Decision Making/ A&P                             Medical Decision Making Amount and/or Complexity of Data Reviewed Labs: ordered.  Risk Prescription drug management.   Patient is a 27 year old male who presents with signs of urethritis.  There is no evidence of epididymitis or other testicular pain.  No external lesions are noted.  His urinalysis is not consistent with infection.  He was treated for STDs.  I had a discussion about going ahead and treating him for gonorrhea and chlamydia and he is amenable to this.  He was given Rocephin and started on doxycycline.  He does report in his chart of penicillin allergy.  However on chart review, he has received Rocephin recently as well as Unasyn for a peritonsillar abscess.    Final Clinical Impression(s) / ED Diagnoses Final diagnoses:  Urethritis    Rx / DC Orders ED Discharge Orders          Ordered    doxycycline (VIBRAMYCIN) 100 MG capsule  2 times daily        02/22/23 4268              Rolan Bucco, MD 02/22/23 1417

## 2023-02-22 NOTE — ED Notes (Signed)
ED Provider at bedside. 

## 2023-02-22 NOTE — ED Triage Notes (Signed)
Pt reports white penile discharge that started this morning. Also endorses mild burning with urination. Concerned for std exposure from recent partner.

## 2023-02-23 LAB — GC/CHLAMYDIA PROBE AMP (~~LOC~~) NOT AT ARMC
Chlamydia: POSITIVE — AB
Comment: NEGATIVE
Comment: NORMAL
Neisseria Gonorrhea: NEGATIVE

## 2023-02-23 LAB — RPR: RPR Ser Ql: NONREACTIVE

## 2023-06-21 ENCOUNTER — Encounter (HOSPITAL_BASED_OUTPATIENT_CLINIC_OR_DEPARTMENT_OTHER): Payer: Self-pay

## 2023-06-21 ENCOUNTER — Emergency Department (HOSPITAL_BASED_OUTPATIENT_CLINIC_OR_DEPARTMENT_OTHER)
Admission: EM | Admit: 2023-06-21 | Discharge: 2023-06-21 | Disposition: A | Discharge status: Home / Self Care | Payer: BC Managed Care – PPO | Attending: Emergency Medicine | Admitting: Emergency Medicine

## 2023-06-21 DIAGNOSIS — Z1152 Encounter for screening for COVID-19: Secondary | ICD-10-CM | POA: Diagnosis not present

## 2023-06-21 DIAGNOSIS — J029 Acute pharyngitis, unspecified: Secondary | ICD-10-CM | POA: Diagnosis present

## 2023-06-21 LAB — SARS CORONAVIRUS 2 BY RT PCR: SARS Coronavirus 2 by RT PCR: NEGATIVE

## 2023-06-21 LAB — GROUP A STREP BY PCR: Group A Strep by PCR: NOT DETECTED

## 2023-06-21 MED ORDER — KETOROLAC TROMETHAMINE 15 MG/ML IJ SOLN
15.0000 mg | Freq: Once | INTRAMUSCULAR | Status: AC
  Administered 2023-06-21: 15 mg via INTRAMUSCULAR
  Filled 2023-06-21: qty 1

## 2023-06-21 MED ORDER — DEXAMETHASONE SODIUM PHOSPHATE 10 MG/ML IJ SOLN
10.0000 mg | Freq: Once | INTRAMUSCULAR | Status: AC
  Administered 2023-06-21: 10 mg via INTRAMUSCULAR
  Filled 2023-06-21: qty 1

## 2023-06-21 MED ORDER — AMOXICILLIN 500 MG PO CAPS: 500.0000 mg | ORAL_CAPSULE | Freq: Two times a day (BID) | ORAL | 0 refills | Status: AC

## 2023-06-21 MED ORDER — AMOXICILLIN 500 MG PO CAPS
500.0000 mg | ORAL_CAPSULE | Freq: Once | ORAL | Status: AC
  Administered 2023-06-21: 500 mg via ORAL
  Filled 2023-06-21: qty 1

## 2023-06-21 NOTE — ED Notes (Signed)
 RN reviewed discharge instructions with pt. Pt verbalized understanding and had no further questions. VSS upon discharge.  

## 2023-06-21 NOTE — ED Provider Notes (Signed)
[place] EMERGENCY DEPARTMENT AT [facility] Provider Note   CSN: [id number] Arrival date & time: 06/21/23  1901     History Chief Complaint  Patient presents with   Sore Throat    HPI Aaron Alvarado is a 27 y.o. male presenting for severe sore throat.  He has an extensive history, multiple strep infections in the past.  He has a episode of negative strep test complicated by peritonsillar abscess that required ear nose and throat consultation. States that he is trying to avoid the symptoms but it feels very similar.  He denies , nausea vomiting, syncope shortness of breath.  He has been having daily fevers and chills for the last 3 days.  Has tried home remedies without any support..   Patient's recorded medical, surgical, social, medication list and allergies were reviewed in the Snapshot window as part of the initial history.   Review of Systems   Review of Systems  Constitutional:  Negative for chills and fever.  HENT:  Positive for sore throat. Negative for ear pain.   Eyes:  Negative for pain and visual disturbance.  Respiratory:  Negative for cough and shortness of breath.   Cardiovascular:  Negative for chest pain and palpitations.  Gastrointestinal:  Negative for abdominal pain and vomiting.  Genitourinary:  Negative for dysuria and hematuria.  Musculoskeletal:  Negative for arthralgias and back pain.  Skin:  Negative for color change and rash.  Neurological:  Negative for seizures and syncope.  All other systems reviewed and are negative.   Physical Exam Updated Vital Signs BP (!) 151/96 (BP Location: Right Arm)   Pulse 71   Temp 98.7 F (37.1 C)   Resp 18   SpO2 100%  Physical Exam Vitals and nursing note reviewed.  Constitutional:      General: He is not in acute distress.    Appearance: He is well-developed.  HENT:     Head: Normocephalic and atraumatic.     Right Ear: Tympanic membrane and ear canal normal.     Left Ear: Tympanic membrane is  erythematous.     Nose: Rhinorrhea present.  Eyes:     Conjunctiva/sclera: Conjunctivae normal.  Cardiovascular:     Rate and Rhythm: Normal rate and regular rhythm.     Heart sounds: No murmur heard. Pulmonary:     Effort: Pulmonary effort is normal. No respiratory distress.     Breath sounds: Normal breath sounds.  Abdominal:     Palpations: Abdomen is soft.     Tenderness: There is no abdominal tenderness.  Musculoskeletal:        General: No swelling.     Cervical back: Neck supple.  Skin:    General: Skin is warm and dry.     Capillary Refill: Capillary refill takes less than 2 seconds.  Neurological:     Mental Status: He is alert.  Psychiatric:        Mood and Affect: Mood normal.      ED Course/ Medical Decision Making/ A&P    Procedures Procedures   Medications Ordered in ED Medications  dexamethasone (DECADRON) injection 10 mg (has no administration in time range)  ketorolac (TORADOL) 15 MG/ML injection 15 mg (has no administration in time range)  amoxicillin (AMOXIL) capsule 500 mg (has no administration in time range)    Medical Decision Making:   This a complicated 27 year old male.  He has had 3-5 bacterial pharyngitis episodes per year.  His current episode may be more consistent  with a viral pharyngitis, however he has a history of severe infection with peritonsillar abscess that required incision and drainage with a negative strep test in the past. He may be colonized with other pus forming bacteria. Shared medical decision making patient.  He would like to proceed with antibiosis and steroids treating for bacterial pharyngitis.  Given his history of abscess I think this is reasonable.  He currently does not have an abscess on exam. Recommended follow-up with an ear nose and throat doctor given recurrent bacterial pharyngitis episodes per year for consideration for tonsillectomy in the outpatient setting. Disposition:  I have considered need for  hospitalization, however, considering all of the above, I believe this patient is stable for discharge at this time.  Patient/family educated about specific return precautions for given chief complaint and symptoms.  Patient/family educated about follow-up with PCP.     Patient/family expressed understanding of return precautions and need for follow-up. Patient spoken to regarding all imaging and laboratory results and appropriate follow up for these results. All education provided in verbal form with additional information in written form. Time was allowed for answering of patient questions. Patient discharged.    Emergency Department Medication Summary:   Medications  dexamethasone (DECADRON) injection 10 mg (has no administration in time range)  ketorolac (TORADOL) 15 MG/ML injection 15 mg (has no administration in time range)  amoxicillin (AMOXIL) capsule 500 mg (has no administration in time range)               Clinical Impression:  1. Sore throat      Discharge   Final Clinical Impression(s) / ED Diagnoses Final diagnoses:  Sore throat    Rx / DC Orders ED Discharge Orders          Ordered    Ambulatory referral to ENT        06/21/23 2046    amoxicillin (AMOXIL) 500 MG capsule  2 times daily        06/21/23 2048              Glyn Ade, MD 06/21/23 2101

## 2023-06-21 NOTE — ED Triage Notes (Signed)
Pt states his throat has been hurting since Friday, "bowels changed, just want to get checked out." Endorses hx strep, wants testing

## 2023-09-10 ENCOUNTER — Emergency Department (HOSPITAL_BASED_OUTPATIENT_CLINIC_OR_DEPARTMENT_OTHER)
Admission: EM | Admit: 2023-09-10 | Discharge: 2023-09-10 | Disposition: A | Discharge status: Home / Self Care | Payer: BC Managed Care – PPO | Attending: Emergency Medicine | Admitting: Emergency Medicine

## 2023-09-10 ENCOUNTER — Other Ambulatory Visit: Payer: Self-pay

## 2023-09-10 ENCOUNTER — Encounter (HOSPITAL_BASED_OUTPATIENT_CLINIC_OR_DEPARTMENT_OTHER): Payer: Self-pay

## 2023-09-10 ENCOUNTER — Emergency Department (HOSPITAL_BASED_OUTPATIENT_CLINIC_OR_DEPARTMENT_OTHER): Payer: BC Managed Care – PPO

## 2023-09-10 DIAGNOSIS — R110 Nausea: Secondary | ICD-10-CM | POA: Diagnosis not present

## 2023-09-10 DIAGNOSIS — R197 Diarrhea, unspecified: Secondary | ICD-10-CM | POA: Insufficient documentation

## 2023-09-10 DIAGNOSIS — Z20822 Contact with and (suspected) exposure to covid-19: Secondary | ICD-10-CM | POA: Insufficient documentation

## 2023-09-10 DIAGNOSIS — R000 Tachycardia, unspecified: Secondary | ICD-10-CM | POA: Diagnosis not present

## 2023-09-10 DIAGNOSIS — J020 Streptococcal pharyngitis: Secondary | ICD-10-CM | POA: Diagnosis not present

## 2023-09-10 LAB — COMPREHENSIVE METABOLIC PANEL
ALT: 18 U/L (ref 0–44)
AST: 14 U/L — ABNORMAL LOW (ref 15–41)
Albumin: 4.7 g/dL (ref 3.5–5.0)
Alkaline Phosphatase: 52 U/L (ref 38–126)
Anion gap: 8 (ref 5–15)
BUN: 11 mg/dL (ref 6–20)
CO2: 26 mmol/L (ref 22–32)
Calcium: 10.1 mg/dL (ref 8.9–10.3)
Chloride: 103 mmol/L (ref 98–111)
Creatinine, Ser: 0.91 mg/dL (ref 0.61–1.24)
GFR, Estimated: >60 mL/min (ref >60)
Glucose, Bld: 103 mg/dL — ABNORMAL HIGH (ref 70–99)
Potassium: 4 mmol/L (ref 3.5–5.1)
Sodium: 137 mmol/L (ref 135–145)
Total Bilirubin: 0.9 mg/dL (ref 0.3–1.2)
Total Protein: 8 g/dL (ref 6.5–8.1)

## 2023-09-10 LAB — GROUP A STREP BY PCR: Group A Strep by PCR: DETECTED — AB

## 2023-09-10 LAB — CBC
HCT: 43.2 % (ref 39.0–52.0)
Hemoglobin: 14 g/dL (ref 13.0–17.0)
MCH: 27.5 pg (ref 26.0–34.0)
MCHC: 32.4 g/dL (ref 30.0–36.0)
MCV: 84.7 fL (ref 80.0–100.0)
Platelets: 248 10*3/uL (ref 150–400)
RBC: 5.1 MIL/uL (ref 4.22–5.81)
RDW: 12.9 % (ref 11.5–15.5)
WBC: 19.8 10*3/uL — ABNORMAL HIGH (ref 4.0–10.5)
nRBC: 0 % (ref 0.0–0.2)

## 2023-09-10 LAB — RESP PANEL BY RT-PCR (RSV, FLU A&B, COVID)  RVPGX2
Influenza A by PCR: NEGATIVE
Influenza B by PCR: NEGATIVE
Resp Syncytial Virus by PCR: NEGATIVE
SARS Coronavirus 2 by RT PCR: NEGATIVE

## 2023-09-10 LAB — URINALYSIS, ROUTINE W REFLEX MICROSCOPIC
Bilirubin Urine: NEGATIVE
Glucose, UA: NEGATIVE mg/dL
Hgb urine dipstick: NEGATIVE
Ketones, ur: NEGATIVE mg/dL
Leukocytes,Ua: NEGATIVE
Nitrite: NEGATIVE
Protein, ur: NEGATIVE mg/dL
Specific Gravity, Urine: 1.019 (ref 1.005–1.030)
pH: 7 (ref 5.0–8.0)

## 2023-09-10 LAB — LIPASE, BLOOD: Lipase: 36 U/L (ref 11–51)

## 2023-09-10 MED ORDER — DEXAMETHASONE SODIUM PHOSPHATE 10 MG/ML IJ SOLN
10.0000 mg | Freq: Once | INTRAMUSCULAR | Status: AC
  Administered 2023-09-10: 10 mg via INTRAVENOUS
  Filled 2023-09-10: qty 1

## 2023-09-10 MED ORDER — TRAMADOL HCL 50 MG PO TABS: 50.0000 mg | ORAL_TABLET | Freq: Two times a day (BID) | ORAL | 0 refills | Status: DC | PRN

## 2023-09-10 MED ORDER — AMOXICILLIN-POT CLAVULANATE 875-125 MG PO TABS: 1.0000 | ORAL_TABLET | Freq: Two times a day (BID) | ORAL | 0 refills | Status: DC

## 2023-09-10 MED ORDER — CLINDAMYCIN HCL 300 MG PO CAPS: 300.0000 mg | ORAL_CAPSULE | Freq: Three times a day (TID) | ORAL | 0 refills | Status: DC

## 2023-09-10 MED ORDER — ACETAMINOPHEN 500 MG PO TABS
1000.0000 mg | ORAL_TABLET | Freq: Once | ORAL | Status: AC
  Administered 2023-09-10: 1000 mg via ORAL
  Filled 2023-09-10: qty 2

## 2023-09-10 MED ORDER — KETOROLAC TROMETHAMINE 30 MG/ML IJ SOLN
30.0000 mg | Freq: Once | INTRAMUSCULAR | Status: AC
  Administered 2023-09-10: 30 mg via INTRAVENOUS
  Filled 2023-09-10: qty 1

## 2023-09-10 MED ORDER — IOHEXOL 300 MG/ML  SOLN
100.0000 mL | Freq: Once | INTRAMUSCULAR | Status: AC | PRN
  Administered 2023-09-10: 100 mL via INTRAVENOUS

## 2023-09-10 MED ORDER — ONDANSETRON HCL 4 MG/2ML IJ SOLN
4.0000 mg | Freq: Once | INTRAMUSCULAR | Status: AC
  Administered 2023-09-10: 4 mg via INTRAVENOUS
  Filled 2023-09-10: qty 2

## 2023-09-10 NOTE — ED Triage Notes (Signed)
Patient reports abdominal pain, chills, night sweats, and diarrhea that started yesterday after eating [employer].

## 2023-09-10 NOTE — ED Notes (Signed)
Patient given discharge instructions. Questions were answered. Patient verbalized understanding of discharge instructions and care at home.  Discharged with family 

## 2023-09-10 NOTE — ED Provider Notes (Signed)
[place] EMERGENCY DEPARTMENT AT [facility] Provider Note   CSN: [id number] Arrival date & time: 09/10/23  0709     History  Chief Complaint  Patient presents with   Abdominal Pain        Chills    Aaron Alvarado is a 27 y.o. male.  HPI   27 year old male presents emergency department with diarrhea, nausea, fever.  Recently completed oral antibiotic regimen for bacterial pharyngitis.  States that he improved.  Then he ate at [facility] and immediately following that he developed periumbilical abdominal cramping, nonbloody diarrhea, fevers, nausea and decreased appetite.  Since waking up this morning he feels like his throat is sore again.  No vomiting.  No history of previous surgeries in his abdomen.  Not recently tested for flu/COVID/RSV.  Did not take any over-the-counter medications prior to arrival.  Home Medications Prior to Admission medications   Medication Sig Start Date End Date Taking? Authorizing Provider  doxycycline (VIBRAMYCIN) 100 MG capsule Take 1 capsule (100 mg total) by mouth 2 (two) times daily. One po bid x 7 days 02/22/23   Rolan Bucco, MD  ibuprofen (ADVIL) 600 MG tablet Take 1 tablet (600 mg total) by mouth every 6 (six) hours as needed. 12/24/22   Achille Rich, PA-C      Allergies    Penicillins    Review of Systems   Review of Systems  Constitutional:  Positive for appetite change, fatigue and fever.  Respiratory:  Negative for shortness of breath.   Cardiovascular:  Negative for chest pain.  Gastrointestinal:  Positive for abdominal pain, diarrhea and nausea. Negative for abdominal distention, blood in stool and vomiting.  Genitourinary:  Negative for dysuria.  Musculoskeletal:  Positive for myalgias.  Skin:  Negative for rash.  Neurological:  Negative for headaches.    Physical Exam Updated Vital Signs BP 126/78 (BP Location: Right Arm)   Pulse 100   Temp (!) 101.2 F (38.4 C) (Oral)   Resp 20   Ht 6' (1.829 m)   Wt 131.5  kg   SpO2 98%   BMI 39.33 kg/m  Physical Exam Vitals and nursing note reviewed.  Constitutional:      General: He is not in acute distress.    Appearance: Normal appearance.  HENT:     Head: Normocephalic.     Mouth/Throat:     Mouth: Mucous membranes are moist.  Cardiovascular:     Rate and Rhythm: Tachycardia present.  Pulmonary:     Effort: Pulmonary effort is normal. No respiratory distress.  Abdominal:     General: Bowel sounds are normal. There is no distension.     Palpations: Abdomen is soft.     Tenderness: There is abdominal tenderness in the periumbilical area. There is no guarding.  Skin:    General: Skin is warm.  Neurological:     Mental Status: He is alert and oriented to person, place, and time. Mental status is at baseline.  Psychiatric:        Mood and Affect: Mood normal.     ED Results / Procedures / Treatments   Labs (all labs ordered are listed, but only abnormal results are displayed) Labs Reviewed  COMPREHENSIVE METABOLIC PANEL - Abnormal; Notable for the following components:      Result Value   Glucose, Bld 103 (*)    AST 14 (*)    All other components within normal limits  CBC - Abnormal; Notable for the following components:   WBC  19.8 (*)    All other components within normal limits  RESP PANEL BY RT-PCR (RSV, FLU A&B, COVID)  [id number]  GROUP A STREP BY PCR  LIPASE, BLOOD  URINALYSIS, ROUTINE W REFLEX MICROSCOPIC    EKG None  Radiology No results found.  Procedures Procedures    Medications Ordered in ED Medications  ondansetron (ZOFRAN) injection 4 mg (4 mg Intravenous Given 09/10/23 0817)  ketorolac (TORADOL) 30 MG/ML injection 30 mg (30 mg Intravenous Given 09/10/23 0817)  acetaminophen (TYLENOL) tablet 1,000 mg (1,000 mg Oral Given 09/10/23 5784)    ED Course/ Medical Decision Making/ A&P                                 Medical Decision Making Amount and/or Complexity of Data Reviewed Labs: ordered. Radiology:  ordered.  Risk OTC drugs. Prescription drug management.   27 year old male presents emergency department with fever, abdominal pain, diarrhea and sore throat.  He is febrile and tachycardic on arrival but otherwise nontoxic-appearing.  He has tender abdomen, periumbilical and on the right side as well as pharyngeal erythema.  Reports that he recently completed an amoxicillin regimen for strep throat but the regimen was only 5 to 7 days.  Blood work shows a leukocytosis, abdominal labs are normal.  CT scan of the abdomen ruled out appendicitis.  Strep test is positive.  Patient has pen allergy listed but he states that he cannot take them.  About a year ago he did have pharyngitis with peritonsillar abscess and states that he did well with a course of clindamycin.  Due to his pain allergy, recent amoxicillin and strep pharyngitis will prescribe this.  Patient otherwise feels improved with medications given here in the department.  No signs of peritonsillar abscess at this visit.  Patient at this time appears safe and stable for discharge and close outpatient follow up. Discharge plan and strict return to ED precautions discussed, patient verbalizes understanding and agreement.        Final Clinical Impression(s) / ED Diagnoses Final diagnoses:  None    Rx / DC Orders ED Discharge Orders     None         Rozelle Logan, DO 09/10/23 1212

## 2023-09-10 NOTE — Discharge Instructions (Signed)
You have been seen and discharged from the emergency department.  Your CAT scan showed no appendicitis, you could be suffering from food poisoning.  Stay well-hydrated.  You may take Imodium if needed.  Your testing showed that you are positive for strep.  Based off of review of your paperwork from [facility] a year ago they placed you on Augmentin.  You will be placed back on this medication with pain medicine.  You are given a dose of steroids here in the department.  Due to recurrent strep infections it is recommended that you follow-up with an ear nose and throat doctor for further evaluation.  Follow-up with your primary provider for further evaluation and further care. Take home medications as prescribed. If you have any worsening symptoms or further concerns for your health please return to an emergency department for further evaluation.

## 2023-09-12 ENCOUNTER — Emergency Department (HOSPITAL_BASED_OUTPATIENT_CLINIC_OR_DEPARTMENT_OTHER): Payer: BC Managed Care – PPO

## 2023-09-12 ENCOUNTER — Emergency Department (HOSPITAL_BASED_OUTPATIENT_CLINIC_OR_DEPARTMENT_OTHER)
Admission: EM | Admit: 2023-09-12 | Discharge: 2023-09-12 | Disposition: A | Discharge status: Home / Self Care | Payer: BC Managed Care – PPO | Attending: Emergency Medicine | Admitting: Emergency Medicine

## 2023-09-12 ENCOUNTER — Other Ambulatory Visit: Payer: Self-pay

## 2023-09-12 ENCOUNTER — Encounter (HOSPITAL_BASED_OUTPATIENT_CLINIC_OR_DEPARTMENT_OTHER): Payer: Self-pay | Admitting: Emergency Medicine

## 2023-09-12 DIAGNOSIS — I10 Essential (primary) hypertension: Secondary | ICD-10-CM | POA: Insufficient documentation

## 2023-09-12 DIAGNOSIS — J039 Acute tonsillitis, unspecified: Secondary | ICD-10-CM | POA: Insufficient documentation

## 2023-09-12 DIAGNOSIS — J029 Acute pharyngitis, unspecified: Secondary | ICD-10-CM | POA: Diagnosis present

## 2023-09-12 DIAGNOSIS — R102 Pelvic and perineal pain: Secondary | ICD-10-CM | POA: Diagnosis not present

## 2023-09-12 LAB — BASIC METABOLIC PANEL
Anion gap: 7 (ref 5–15)
BUN: 18 mg/dL (ref 6–20)
CO2: 30 mmol/L (ref 22–32)
Calcium: 9.9 mg/dL (ref 8.9–10.3)
Chloride: 103 mmol/L (ref 98–111)
Creatinine, Ser: 1.07 mg/dL (ref 0.61–1.24)
GFR, Estimated: >60 mL/min (ref >60)
Glucose, Bld: 85 mg/dL (ref 70–99)
Potassium: 4 mmol/L (ref 3.5–5.1)
Sodium: 140 mmol/L (ref 135–145)

## 2023-09-12 LAB — CBC WITH DIFFERENTIAL/PLATELET
Abs Immature Granulocytes: 0.05 10*3/uL (ref 0.00–0.07)
Basophils Absolute: 0 10*3/uL (ref 0.0–0.1)
Basophils Relative: 0 %
Eosinophils Absolute: 0.1 10*3/uL (ref 0.0–0.5)
Eosinophils Relative: 1 %
HCT: 42.8 % (ref 39.0–52.0)
Hemoglobin: 13.8 g/dL (ref 13.0–17.0)
Immature Granulocytes: 0 %
Lymphocytes Relative: 17 %
Lymphs Abs: 2.1 10*3/uL (ref 0.7–4.0)
MCH: 27.7 pg (ref 26.0–34.0)
MCHC: 32.2 g/dL (ref 30.0–36.0)
MCV: 85.9 fL (ref 80.0–100.0)
Monocytes Absolute: 0.7 10*3/uL (ref 0.1–1.0)
Monocytes Relative: 6 %
Neutro Abs: 9.4 10*3/uL — ABNORMAL HIGH (ref 1.7–7.7)
Neutrophils Relative %: 76 %
Platelets: 282 10*3/uL (ref 150–400)
RBC: 4.98 MIL/uL (ref 4.22–5.81)
RDW: 13.5 % (ref 11.5–15.5)
WBC: 12.5 10*3/uL — ABNORMAL HIGH (ref 4.0–10.5)
nRBC: 0 % (ref 0.0–0.2)

## 2023-09-12 MED ORDER — LIDOCAINE VISCOUS HCL 2 % MT SOLN: 15.0000 mL | OROMUCOSAL | 0 refills | Status: DC | PRN

## 2023-09-12 MED ORDER — HYDROCODONE-ACETAMINOPHEN 7.5-325 MG/15ML PO SOLN
10.0000 mL | Freq: Once | ORAL | Status: AC
  Administered 2023-09-12: 10 mL via ORAL
  Filled 2023-09-12 (×2): qty 15

## 2023-09-12 MED ORDER — LIDOCAINE VISCOUS HCL 2 % MT SOLN
15.0000 mL | Freq: Once | OROMUCOSAL | Status: AC
  Administered 2023-09-12: 15 mL via OROMUCOSAL
  Filled 2023-09-12 (×2): qty 15

## 2023-09-12 MED ORDER — HYDROCODONE-ACETAMINOPHEN 7.5-325 MG/15ML PO SOLN: 10.0000 mL | Freq: Four times a day (QID) | ORAL | 0 refills | Status: DC | PRN

## 2023-09-12 MED ORDER — IOHEXOL 300 MG/ML  SOLN
100.0000 mL | Freq: Once | INTRAMUSCULAR | Status: AC | PRN
  Administered 2023-09-12: 80 mL via INTRAVENOUS

## 2023-09-12 NOTE — ED Notes (Signed)
Reviewed discharge instructions, medications, and home care with pt. PA spoke with pt prior to discharge per pt request. Pt exited ED without complications.

## 2023-09-12 NOTE — ED Provider Triage Note (Signed)
Emergency Medicine Provider Triage Evaluation Note  Aaron Alvarado , a 27 y.o. male  was evaluated in triage.  Pt complains of sore throat. Recently diagnosed with strep infection.  Here with worsening sore throat, trouble swallowing. No fever  Review of Systems  Positive: As above Negative: As above  Physical Exam  BP 117/73 (BP Location: Left Arm)   Pulse 90   Temp (!) 97.5 F (36.4 C)   Resp 18   SpO2 98%  Gen:   Awake, no distress   Resp:  Normal effort  MSK:   Moves extremities without difficulty  Other:    Medical Decision Making  Medically screening exam initiated at 4:48 PM.  Appropriate orders placed.  Aaron Alvarado was informed that the remainder of the evaluation will be completed by another provider, this initial triage assessment does not replace that evaluation, and the importance of remaining in the ED until their evaluation is complete.     Fayrene Helper, PA-C 09/12/23 1649

## 2023-09-12 NOTE — ED Provider Notes (Signed)
[place] EMERGENCY DEPARTMENT AT [facility] Provider Note   CSN: [id number] Arrival date & time: 09/12/23  1617     History  Chief Complaint  Patient presents with   Sore Throat    Aaron Alvarado is a 27 y.o. male.  The history is provided by the patient and medical records. No language interpreter was used.  Sore Throat     27 year old male recently diagnosed with strep pharyngitis presenting to ED with complaints of worsening sore throat.  Patient was previously diagnosed with strep throat and was on amoxicillin for 5 to 7 days.  His symptoms still persist and he was seen in the ED 2 days ago for his presentation.  He also have some complaint of abdominal pain and a CT scan of the abdomen pelvis.  Pt was ruled out for appendicitis.  His strep test was positive and patient was subsequently discharged home with clindamycin as treatment.  Patient states he has persistent sore throat, difficulty swallowing, difficulty tolerating his secretions despite taking medication prescribed.  Does not endorse any fever or chills no nausea vomiting diarrhea.  Home Medications Prior to Admission medications   Medication Sig Start Date End Date Taking? Authorizing Provider  clindamycin (CLEOCIN) 300 MG capsule Take 1 capsule (300 mg total) by mouth 3 (three) times daily. 09/10/23   Horton, Clabe Seal, DO  ibuprofen (ADVIL) 600 MG tablet Take 1 tablet (600 mg total) by mouth every 6 (six) hours as needed. 12/24/22   Achille Rich, PA-C  traMADol (ULTRAM) 50 MG tablet Take 1 tablet (50 mg total) by mouth every 12 (twelve) hours as needed. 09/10/23   Horton, Clabe Seal, DO      Allergies    Penicillins    Review of Systems   Review of Systems  All other systems reviewed and are negative.   Physical Exam Updated Vital Signs BP 117/73 (BP Location: Left Arm)   Pulse 90   Temp (!) 97.5 F (36.4 C)   Resp 18   SpO2 98%  Physical Exam Vitals and nursing note reviewed.   Constitutional:      General: He is not in acute distress.    Appearance: He is well-developed.  HENT:     Head: Atraumatic.     Nose: Nose normal.     Mouth/Throat:     Comments: Uvula midline mild posterior oropharyngeal erythema with mild bilateral tonsillar enlargement without exudates.  Normal tonsillar pillar.  No stridor or trismus noted.  No sublingual edema. Eyes:     Conjunctiva/sclera: Conjunctivae normal.  Neck:     Vascular: No carotid bruit.  Cardiovascular:     Rate and Rhythm: Normal rate and regular rhythm.     Pulses: Normal pulses.     Heart sounds: Normal heart sounds.  Pulmonary:     Effort: Pulmonary effort is normal.     Breath sounds: Normal breath sounds. No wheezing, rhonchi or rales.  Abdominal:     Palpations: Abdomen is soft.  Musculoskeletal:     Cervical back: Normal range of motion and neck supple. Tenderness present. No rigidity.  Lymphadenopathy:     Cervical: Cervical adenopathy present.  Skin:    Findings: No rash.  Neurological:     Mental Status: He is alert.     ED Results / Procedures / Treatments   Labs (all labs ordered are listed, but only abnormal results are displayed) Labs Reviewed  CBC WITH DIFFERENTIAL/PLATELET - Abnormal; Notable for the following components:  Result Value   WBC 12.5 (*)    Neutro Abs 9.4 (*)    All other components within normal limits  BASIC METABOLIC PANEL    EKG None  Radiology CT Soft Tissue Neck W Contrast  Result Date: 09/12/2023 CLINICAL DATA:  Sore throat EXAM: CT NECK WITH CONTRAST TECHNIQUE: Multidetector CT imaging of the neck was performed using the standard protocol following the bolus administration of intravenous contrast. RADIATION DOSE REDUCTION: This exam was performed according to the departmental dose-optimization program which includes automated exposure control, adjustment of the mA and/or kV according to patient size and/or use of iterative reconstruction technique.  CONTRAST:  80mL OMNIPAQUE IOHEXOL 300 MG/ML  SOLN COMPARISON:  07/29/2022 FINDINGS: PHARYNX AND LARYNX: The adenoid and palatine tonsils are enlarged. There is no peritonsillar abscess. No retropharyngeal abnormality. The epiglottis and larynx are normal. SALIVARY GLANDS: Normal parotid, submandibular and sublingual glands. THYROID: Normal. LYMPH NODES: Bilateral reactive subcentimeter lymph nodes. VASCULAR: Major cervical vessels are patent. LIMITED INTRACRANIAL: Normal. VISUALIZED ORBITS: Normal. MASTOIDS AND VISUALIZED PARANASAL SINUSES: No fluid levels or advanced mucosal thickening. No mastoid effusion. SKELETON: No bony spinal canal stenosis. No lytic or blastic lesions. UPPER CHEST: Clear. OTHER: None. IMPRESSION: Acute tonsillitis without peritonsillar abscess. Electronically Signed   By: Deatra Robinson M.D.   On: 09/12/2023 21:41    Procedures Procedures    Medications Ordered in ED Medications  lidocaine (XYLOCAINE) 2 % viscous mouth solution 15 mL (15 mLs Mouth/Throat Given 09/12/23 2030)  iohexol (OMNIPAQUE) 300 MG/ML solution 100 mL (80 mLs Intravenous Contrast Given 09/12/23 1859)  HYDROcodone-acetaminophen (HYCET) 7.5-325 mg/15 ml solution 10 mL (10 mLs Oral Given 09/12/23 2033)    ED Course/ Medical Decision Making/ A&P                                 Medical Decision Making Amount and/or Complexity of Data Reviewed Labs: ordered. Radiology: ordered.  Risk Prescription drug management.   BP 117/73 (BP Location: Left Arm)   Pulse 90   Temp 99.3 F (37.4 C) (Oral)   Resp 18   SpO2 98%   55:75 PM 27 year old male recently diagnosed with strep pharyngitis presenting to ED with complaints of worsening sore throat.  Patient was previously diagnosed with strep throat and was on amoxicillin for 5 to 7 days.  His symptoms still persist and he was seen in the ED 2 days ago for his presentation.  He also have some complaint of abdominal pain and a CT scan of the abdomen pelvis.   Pain which rule out appendicitis.  His strep test, positive and patient was subsequently discharged home with clindamycin as treatment.  Patient states he has persistent sore throat, difficulty swallowing, difficulty tolerating his secretions despite taking medication prescribed.  Does not endorse any fever or chills no nausea vomiting diarrhea.  Hide exam remarkable for mild posterior oropharyngeal erythema but uvula midline and at did not appreciate any obvious peritonsillar abscess.  Since patient has neck tenderness as well as complaining of having trouble swallowing.  Will obtain a neck soft tissue CT scan to rule out deep tissue infection.  No evidence of airway compromise at this time.  -Labs ordered, independently viewed and interpreted by me.  Labs remarkable for WBC 12.5, improves from prior -The patient was maintained on a cardiac monitor.  I personally viewed and interpreted the cardiac monitored which showed an underlying rhythm of: NSR -Imaging independently viewed  and interpreted by me and I agree with radiologist's interpretation.  Result remarkable for CT soft tissue neck showing acute tonsillitis without PTA or airway compromise -This patient presents to the ED for concern of sore throat, this involves an extensive number of treatment options, and is a complaint that carries with it a high risk of complications and morbidity.  The differential diagnosis includes tonsilitis, PTA, RPA, Lemier's disease, Ludwig's angina, mono, strep -Co morbidities that complicate the patient evaluation includes HTN, PTSD -Treatment includes hycet, viscous lidocaine -Reevaluation of the patient after these medicines showed that the patient improved -PCP office notes or outside notes reviewed -Escalation to admission/observation considered: patients feels much better, is comfortable with discharge, and will follow up with PCP -Prescription medication considered, patient comfortable with viscous lidocaine  and hycet you have any -Social Determinant of Health considered which includes depression          Final Clinical Impression(s) / ED Diagnoses Final diagnoses:  Tonsillitis    Rx / DC Orders ED Discharge Orders          Ordered    HYDROcodone-acetaminophen (HYCET) 7.5-325 mg/15 ml solution  Every 6 hours PRN        09/12/23 2159    lidocaine (XYLOCAINE) 2 % solution  As needed        09/12/23 2159              Fayrene Helper, PA-C 09/12/23 2201    Anders Simmonds T, DO 09/13/23 2353

## 2023-09-12 NOTE — ED Triage Notes (Signed)
Pt c/o increased sore throat with difficulty swallowing. Recent dx with strep, taking abx

## 2023-09-12 NOTE — Discharge Instructions (Signed)
You have been evaluated for your symptoms.  CT scan today shows evidence of tonsillitis which is inflammations of your tonsils.  Fortunately no evidence of abscess in your throat.  Please continue taking antibiotic as previously prescribed.  You may take Hycet and viscous lidocaine as prescribed as needed for your symptoms.  Please also call and follow-up closely with ear nose throat specialist as you may benefit from tonsillectomy.

## 2023-11-23 ENCOUNTER — Encounter (HOSPITAL_BASED_OUTPATIENT_CLINIC_OR_DEPARTMENT_OTHER): Payer: Self-pay | Admitting: Emergency Medicine

## 2023-11-23 ENCOUNTER — Emergency Department (HOSPITAL_BASED_OUTPATIENT_CLINIC_OR_DEPARTMENT_OTHER)
Admission: EM | Admit: 2023-11-23 | Discharge: 2023-11-23 | Disposition: A | Discharge status: Home / Self Care | Payer: 59 | Source: Home / Self Care | Attending: Emergency Medicine | Admitting: Emergency Medicine

## 2023-11-23 ENCOUNTER — Encounter (HOSPITAL_BASED_OUTPATIENT_CLINIC_OR_DEPARTMENT_OTHER): Payer: Self-pay

## 2023-11-23 ENCOUNTER — Emergency Department (HOSPITAL_BASED_OUTPATIENT_CLINIC_OR_DEPARTMENT_OTHER)
Admission: EM | Admit: 2023-11-23 | Discharge: 2023-11-23 | Discharge status: Against Medical Advice | Payer: 59 | Attending: Emergency Medicine | Admitting: Emergency Medicine

## 2023-11-23 ENCOUNTER — Other Ambulatory Visit: Payer: Self-pay

## 2023-11-23 DIAGNOSIS — Z5321 Procedure and treatment not carried out due to patient leaving prior to being seen by health care provider: Secondary | ICD-10-CM | POA: Insufficient documentation

## 2023-11-23 DIAGNOSIS — I10 Essential (primary) hypertension: Secondary | ICD-10-CM | POA: Insufficient documentation

## 2023-11-23 DIAGNOSIS — K625 Hemorrhage of anus and rectum: Secondary | ICD-10-CM | POA: Insufficient documentation

## 2023-11-23 DIAGNOSIS — K649 Unspecified hemorrhoids: Secondary | ICD-10-CM | POA: Insufficient documentation

## 2023-11-23 DIAGNOSIS — Z79899 Other long term (current) drug therapy: Secondary | ICD-10-CM | POA: Insufficient documentation

## 2023-11-23 NOTE — ED Provider Notes (Signed)
 [place] EMERGENCY DEPARTMENT AT [facility] Provider Note   CSN: [id number] Arrival date & time: 11/23/23  1811     History  Chief Complaint  Patient presents with   Hemorrhoids    Rayner R Grimsley is a 28 y.o. male with past medical history significant for obesity, GERD, hypertension, anxiety who presents with concern for hemorrhoid with bleeding since this morning.  Seen evaluated by PCP and told to come to the emergency department if it continues to bleed.  He denies any dizziness, lightheadedness.  He has no known clotting disorder.  He has not tried any hemorrhoid treatment yet.  Patient reports lesion has been present for a month.  HPI     Home Medications Prior to Admission medications   Medication Sig Start Date End Date Taking? Authorizing Provider  clindamycin  (CLEOCIN ) 300 MG capsule Take 1 capsule (300 mg total) by mouth 3 (three) times daily. 09/10/23   Horton, Roxie HERO, DO  HYDROcodone -acetaminophen  (HYCET) 7.5-325 mg/15 ml solution Take 10 mLs by mouth every 6 (six) hours as needed for moderate pain (pain score 4-6). 09/12/23   Nivia Colon, PA-C  ibuprofen  (ADVIL ) 600 MG tablet Take 1 tablet (600 mg total) by mouth every 6 (six) hours as needed. 12/24/22   Bernis Ernst, PA-C  lidocaine  (XYLOCAINE ) 2 % solution Use as directed 15 mLs in the mouth or throat as needed for mouth pain. 09/12/23   Nivia Colon, PA-C  traMADol  (ULTRAM ) 50 MG tablet Take 1 tablet (50 mg total) by mouth every 12 (twelve) hours as needed. 09/10/23   Horton, Roxie HERO, DO      Allergies    Penicillins    Review of Systems   Review of Systems  All other systems reviewed and are negative.   Physical Exam Updated Vital Signs BP (!) 143/95   Pulse 69   Temp 98.4 F (36.9 C) (Oral)   Resp 19   Ht 6' (1.829 m)   Wt 129.3 kg   SpO2 98%   BMI 38.65 kg/m  Physical Exam Vitals and nursing note reviewed.  Constitutional:      General: He is not in acute distress.    Appearance:  Normal appearance.  HENT:     Head: Normocephalic and atraumatic.  Eyes:     General:        Right eye: No discharge.        Left eye: No discharge.  Cardiovascular:     Rate and Rhythm: Normal rate and regular rhythm.  Pulmonary:     Effort: Pulmonary effort is normal. No respiratory distress.  Genitourinary:    Comments: Patient with an irritated hemorrhoid perirectally versus skin tag, there is a small amount of dried blood but no active bleeding at the site. Musculoskeletal:        General: No deformity.  Skin:    General: Skin is warm and dry.  Neurological:     Mental Status: He is alert and oriented to person, place, and time.  Psychiatric:        Mood and Affect: Mood normal.        Behavior: Behavior normal.     ED Results / Procedures / Treatments   Labs (all labs ordered are listed, but only abnormal results are displayed) Labs Reviewed - No data to display  EKG None  Radiology No results found.  Procedures Procedures    Medications Ordered in ED Medications - No data to display  ED Course/ Medical Decision  Making/ A&P                                 Medical Decision Making  This patient is a 28 y.o. male who presents to the ED for concern of rectal bleeding from known hemorrhoid.   Differential diagnoses prior to evaluation: Acute gi bleed, hemorrhoid, vs other  Past Medical History / Social History / Additional history: Chart reviewed. Pertinent results include: obesity, GERD, hypertension, anxiety   Physical Exam: Physical exam performed. The pertinent findings include: Patient with an irritated hemorrhoid perirectally versus skin tag, there is a small amount of dried blood but no active bleeding at the site.  Medications / Treatment: Hemorrhoid cream, no additional treatment   Disposition: After consideration of the diagnostic results and the patients response to treatment, I feel that patient is stable for discharge with no additional  treatment, irritated, bleeding hemorrhoid, no active bleeding at this time .   emergency department workup does not suggest an emergent condition requiring admission or immediate intervention beyond what has been performed at this time. The plan is: as above. The patient is safe for discharge and has been instructed to return immediately for worsening symptoms, change in symptoms or any other concerns.  Final Clinical Impression(s) / ED Diagnoses Final diagnoses:  None    Rx / DC Orders ED Discharge Orders     None         Rosan Sherlean VEAR DEVONNA 11/23/23 KENITH Jerral Meth, MD 11/23/23 2310

## 2023-11-23 NOTE — ED Triage Notes (Signed)
 Pt arrives with c/o a hemorrhoid that has been bleeding since this morning. Pt was here earlier for the same.

## 2023-11-23 NOTE — ED Triage Notes (Signed)
 Pt caox4, ambulatory, c/o bright red bleeding from a lesion at the rectum, pt reports the bump has been there for approx 1 mo but that he thinks he wiped too hard and which happened approx 0730 this morning and came to ED because it is still bleeding. Pt denies PMH hemorrhoids.

## 2023-11-23 NOTE — Discharge Instructions (Signed)
 If you have significant worsening of your bleeding including bleeding for several hours between bowel movements or several days between bowel movements, bleeding through an entire menstrual pad, began to feel lightheaded, dizzy because of blood loss then I would return for further evaluation.  Otherwise some amount of bleeding from an irritated/active hemorrhoid is normal, I would recommend taking stool softeners and applying the cream that your primary care doctor provided.  Please return if you have significant worsening bleeding as described above despite treatment.

## 2024-02-22 ENCOUNTER — Encounter (HOSPITAL_COMMUNITY): Payer: Self-pay | Admitting: Otolaryngology

## 2024-02-22 ENCOUNTER — Other Ambulatory Visit: Payer: Self-pay

## 2024-02-22 NOTE — H&P (Signed)
 HPI:   Aaron Alvarado is a 28 y.o. male who presents as a consult Patient.   Referring Provider: Fayrene Helper, PA-C  Chief complaint: Tonsils.  HPI: History of tonsillitis including strep throat, about twice annually. 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 He had a peritonsillar abscess a little over a year ago drained. He has never had another 1. He is on CPAP for sleep apnea but does not use it every night.  PMH/Meds/All/SocHx/FamHx/ROS:   History reviewed. No pertinent past medical history.  History reviewed. No pertinent surgical history.  No family history of bleeding disorders, wound healing problems or difficulty with anesthesia.     Current Outpatient Medications:  amLODIPine (NORVASC) 5 mg tablet, Take 1 tablet by mouth daily., Disp: , Rfl:  losartan (COZAAR) 25 mg tablet, , Disp: , Rfl:   A complete ROS was performed with pertinent positives/negatives noted in the HPI. The remainder of the ROS are negative.   Physical Exam:   BP (!) 152/120  Pulse 86  Temp 97.8 F (36.6 C) (Temporal)  Ht 1.803 m (5\' 11" )  Wt 132 kg (290 lb)  BMI 40.45 kg/m   General: Overweight young man, healthy and alert, in no distress, breathing easily. Normal affect. In a pleasant mood. Head: Normocephalic, atraumatic. No masses, or scars. Eyes: Pupils are equal, and reactive to light. Vision is grossly intact. No spontaneous or gaze nystagmus. Ears: Ear canals are clear. Tympanic membranes are intact, with normal landmarks and the middle ears are clear and healthy. Hearing: Grossly normal. Nose: Nasal cavities are clear with healthy mucosa, no polyps or exudate. Airways are patent. Face: No masses or scars, facial nerve function is symmetric. Oral Cavity: No mucosal abnormalities are noted. Tongue with normal mobility. Dentition appears healthy. Oropharynx: Tonsils are symmetric, 3+ in size. There are no mucosal masses identified. Tongue base appears normal and  healthy. Larynx/Hypopharynx: deferred Chest: Deferred Neck: No palpable masses, no cervical adenopathy, no thyroid nodules or enlargement. Neuro: Cranial nerves II-XII with normal function. Balance: Normal gate. Other findings: none.  Independent Review of Additional Tests or Records:  none  Procedures:  none  Impression & Plans:  Recurrent tonsillitis, twice annually for the past 2 or 3 years. 1 peritonsillar abscess that was drained over a year ago. Based on these historical features he does not meet criteria for tonsillectomy. Also with his degree of sleep apnea general anesthesia particularly involving the airway could be more dangerous. Recommend efforts at weight loss. Recommend he wear his CPAP every night. Follow-up if these infections become more frequent or if he gets another abscess.

## 2024-02-22 NOTE — Progress Notes (Signed)
 PCP - Dr. Waldon Merl Cardiologist - Denies  PPM/ICD - Denies Device Orders - n/a Rep Notified - n/a  Chest x-ray - N/A EKG - Last one 02/12/16, will obtain new one on day of surgery.  Stress Test -  Denies ECHO -  Denies Cardiac Cath -  Denies  Sleep Study -  Yes CPAP - no cpap needed  Non-diabetic  Last dose of GLP1 agonist-  denies GLP1 instructions: n/a  Blood Thinner Instructions: denies Aspirin Instructions:denies - instructed to not take prior to surgery  ERAS Protcol - npo at midnight PRE-SURGERY Ensure or G2-none   COVID TEST- N/A   Anesthesia review: yes   Patient denies shortness of breath, fever, cough and chest pain on SDW call.  Patient was given pre-op instructions over the phone. Patient verbalized understanding of instructions provided.

## 2024-02-24 ENCOUNTER — Other Ambulatory Visit: Payer: Self-pay

## 2024-02-24 ENCOUNTER — Ambulatory Visit (HOSPITAL_BASED_OUTPATIENT_CLINIC_OR_DEPARTMENT_OTHER): Admitting: Certified Registered Nurse Anesthetist

## 2024-02-24 ENCOUNTER — Ambulatory Visit (HOSPITAL_COMMUNITY): Admitting: Certified Registered Nurse Anesthetist

## 2024-02-24 ENCOUNTER — Ambulatory Visit (HOSPITAL_COMMUNITY)
Admission: RE | Admit: 2024-02-24 | Discharge: 2024-02-24 | Disposition: A | Discharge status: Home / Self Care | Attending: Otolaryngology | Admitting: Otolaryngology

## 2024-02-24 ENCOUNTER — Encounter (HOSPITAL_COMMUNITY): Payer: Self-pay | Admitting: Otolaryngology

## 2024-02-24 ENCOUNTER — Encounter (HOSPITAL_COMMUNITY)
Admission: RE | Disposition: A | Discharge status: Home / Self Care | Payer: Self-pay | Source: Home / Self Care | Attending: Otolaryngology

## 2024-02-24 DIAGNOSIS — E66813 Obesity, class 3: Secondary | ICD-10-CM | POA: Insufficient documentation

## 2024-02-24 DIAGNOSIS — Z6841 Body Mass Index (BMI) 40.0 and over, adult: Secondary | ICD-10-CM | POA: Diagnosis not present

## 2024-02-24 DIAGNOSIS — G4733 Obstructive sleep apnea (adult) (pediatric): Secondary | ICD-10-CM | POA: Diagnosis present

## 2024-02-24 DIAGNOSIS — I10 Essential (primary) hypertension: Secondary | ICD-10-CM | POA: Diagnosis not present

## 2024-02-24 DIAGNOSIS — J0301 Acute recurrent streptococcal tonsillitis: Secondary | ICD-10-CM

## 2024-02-24 DIAGNOSIS — Z9089 Acquired absence of other organs: Secondary | ICD-10-CM

## 2024-02-24 HISTORY — DX: Sleep apnea, unspecified: G47.30

## 2024-02-24 HISTORY — PX: TONSILLECTOMY: SHX5217

## 2024-02-24 HISTORY — DX: Gastro-esophageal reflux disease without esophagitis: K21.9

## 2024-02-24 LAB — BASIC METABOLIC PANEL WITH GFR
Anion gap: 13 (ref 5–15)
BUN: 9 mg/dL (ref 6–20)
CO2: 21 mmol/L — ABNORMAL LOW (ref 22–32)
Calcium: 9.5 mg/dL (ref 8.9–10.3)
Chloride: 104 mmol/L (ref 98–111)
Creatinine, Ser: 0.82 mg/dL (ref 0.61–1.24)
GFR, Estimated: >60 mL/min (ref >60)
Glucose, Bld: 88 mg/dL (ref 70–99)
Potassium: 3.9 mmol/L (ref 3.5–5.1)
Sodium: 138 mmol/L (ref 135–145)

## 2024-02-24 LAB — CBC
HCT: 45.2 % (ref 39.0–52.0)
Hemoglobin: 14.7 g/dL (ref 13.0–17.0)
MCH: 27.6 pg (ref 26.0–34.0)
MCHC: 32.5 g/dL (ref 30.0–36.0)
MCV: 84.8 fL (ref 80.0–100.0)
Platelets: 298 10*3/uL (ref 150–400)
RBC: 5.33 MIL/uL (ref 4.22–5.81)
RDW: 12.9 % (ref 11.5–15.5)
WBC: 6.4 10*3/uL (ref 4.0–10.5)
nRBC: 0 % (ref 0.0–0.2)

## 2024-02-24 SURGERY — TONSILLECTOMY
Anesthesia: General | Site: Mouth | Laterality: Bilateral

## 2024-02-24 MED ORDER — OXYCODONE HCL 5 MG PO TABS: 5.0000 mg | ORAL_TABLET | Freq: Once | ORAL | Status: AC | PRN

## 2024-02-24 MED ORDER — OXYCODONE HCL 5 MG/5ML PO SOLN
5.0000 mg | Freq: Once | ORAL | Status: AC | PRN
  Administered 2024-02-24: 5 mg via ORAL

## 2024-02-24 MED ORDER — MIDAZOLAM HCL 2 MG/2ML IJ SOLN
INTRAMUSCULAR | Status: DC | PRN
  Administered 2024-02-24: 2 mg via INTRAVENOUS

## 2024-02-24 MED ORDER — LIDOCAINE 2% (20 MG/ML) 5 ML SYRINGE
INTRAMUSCULAR | Status: AC
  Filled 2024-02-24: qty 5

## 2024-02-24 MED ORDER — PROPOFOL 10 MG/ML IV BOLUS
INTRAVENOUS | Status: DC | PRN
  Administered 2024-02-24: 50 mg via INTRAVENOUS
  Administered 2024-02-24: 270 mg via INTRAVENOUS

## 2024-02-24 MED ORDER — HYDROCODONE-ACETAMINOPHEN 7.5-325 MG/15ML PO SOLN: 15.0000 mL | Freq: Four times a day (QID) | ORAL | 0 refills | Status: DC | PRN

## 2024-02-24 MED ORDER — ROCURONIUM BROMIDE 10 MG/ML (PF) SYRINGE
PREFILLED_SYRINGE | INTRAVENOUS | Status: DC | PRN
  Administered 2024-02-24: 60 mg via INTRAVENOUS

## 2024-02-24 MED ORDER — ONDANSETRON HCL 4 MG/2ML IJ SOLN: 4.0000 mg | Freq: Four times a day (QID) | INTRAMUSCULAR | Status: DC | PRN

## 2024-02-24 MED ORDER — DEXAMETHASONE SODIUM PHOSPHATE 10 MG/ML IJ SOLN
INTRAMUSCULAR | Status: DC | PRN
  Administered 2024-02-24: 10 mg via INTRAVENOUS

## 2024-02-24 MED ORDER — FENTANYL CITRATE (PF) 100 MCG/2ML IJ SOLN
INTRAMUSCULAR | Status: AC
  Filled 2024-02-24: qty 2

## 2024-02-24 MED ORDER — LIDOCAINE 2% (20 MG/ML) 5 ML SYRINGE
INTRAMUSCULAR | Status: DC | PRN
  Administered 2024-02-24: 60 mg via INTRAVENOUS

## 2024-02-24 MED ORDER — OXYCODONE HCL 5 MG/5ML PO SOLN
ORAL | Status: AC
  Filled 2024-02-24: qty 5

## 2024-02-24 MED ORDER — ONDANSETRON 4 MG PO TBDP: 4.0000 mg | ORAL_TABLET | Freq: Three times a day (TID) | ORAL | 0 refills | Status: DC | PRN

## 2024-02-24 MED ORDER — 0.9 % SODIUM CHLORIDE (POUR BTL) OPTIME
TOPICAL | Status: DC | PRN
Start: 2024-02-24 — End: 2024-02-24
  Administered 2024-02-24: 1000 mL

## 2024-02-24 MED ORDER — ONDANSETRON HCL 4 MG/2ML IJ SOLN
INTRAMUSCULAR | Status: AC
  Filled 2024-02-24: qty 2

## 2024-02-24 MED ORDER — LOSARTAN POTASSIUM 50 MG PO TABS: 50.0000 mg | ORAL_TABLET | Freq: Every day | ORAL | Status: DC

## 2024-02-24 MED ORDER — FENTANYL CITRATE (PF) 100 MCG/2ML IJ SOLN
25.0000 ug | INTRAMUSCULAR | Status: DC | PRN
  Administered 2024-02-24: 50 ug via INTRAVENOUS
  Administered 2024-02-24 (×2): 25 ug via INTRAVENOUS

## 2024-02-24 MED ORDER — MIDAZOLAM HCL 2 MG/2ML IJ SOLN
INTRAMUSCULAR | Status: AC
  Filled 2024-02-24: qty 2

## 2024-02-24 MED ORDER — SUGAMMADEX SODIUM 200 MG/2ML IV SOLN
INTRAVENOUS | Status: DC | PRN
  Administered 2024-02-24: 400 mg via INTRAVENOUS

## 2024-02-24 MED ORDER — OXYCODONE HCL 5 MG/5ML PO SOLN: 5.0000 mg | Freq: Once | ORAL | Status: DC | PRN

## 2024-02-24 MED ORDER — EMTRICITABINE-TENOFOVIR AF 200-25 MG PO TABS
1.0000 | ORAL_TABLET | Freq: Every day | ORAL | Status: DC
  Filled 2024-02-24: qty 1

## 2024-02-24 MED ORDER — EMERGEN-C VITAMIN C PO PACK: 1.0000 | PACK | Freq: Every day | ORAL | Status: DC

## 2024-02-24 MED ORDER — DEXAMETHASONE SODIUM PHOSPHATE 10 MG/ML IJ SOLN
INTRAMUSCULAR | Status: AC
  Filled 2024-02-24: qty 1

## 2024-02-24 MED ORDER — ONDANSETRON HCL 4 MG/2ML IJ SOLN
INTRAMUSCULAR | Status: DC | PRN
  Administered 2024-02-24: 4 mg via INTRAVENOUS

## 2024-02-24 MED ORDER — ORAL CARE MOUTH RINSE: 15.0000 mL | Freq: Once | OROMUCOSAL | Status: AC

## 2024-02-24 MED ORDER — LACTATED RINGERS IV SOLN: INTRAVENOUS | Status: DC

## 2024-02-24 MED ORDER — FENTANYL CITRATE (PF) 250 MCG/5ML IJ SOLN
INTRAMUSCULAR | Status: DC | PRN
  Administered 2024-02-24: 100 ug via INTRAVENOUS
  Administered 2024-02-24 (×3): 50 ug via INTRAVENOUS

## 2024-02-24 MED ORDER — GLYCOPYRROLATE PF 0.2 MG/ML IJ SOSY
PREFILLED_SYRINGE | INTRAMUSCULAR | Status: AC
  Filled 2024-02-24: qty 1

## 2024-02-24 MED ORDER — ROCURONIUM BROMIDE 10 MG/ML (PF) SYRINGE
PREFILLED_SYRINGE | INTRAVENOUS | Status: AC
  Filled 2024-02-24: qty 10

## 2024-02-24 MED ORDER — OXYCODONE HCL 5 MG PO TABS: 5.0000 mg | ORAL_TABLET | Freq: Once | ORAL | Status: DC | PRN

## 2024-02-24 MED ORDER — AMLODIPINE BESYLATE 10 MG PO TABS: 10.0000 mg | ORAL_TABLET | Freq: Every day | ORAL | Status: DC

## 2024-02-24 MED ORDER — PROPOFOL 10 MG/ML IV BOLUS
INTRAVENOUS | Status: AC
  Filled 2024-02-24: qty 20

## 2024-02-24 MED ORDER — FENTANYL CITRATE (PF) 100 MCG/2ML IJ SOLN: 25.0000 ug | INTRAMUSCULAR | Status: DC | PRN

## 2024-02-24 MED ORDER — GLYCOPYRROLATE PF 0.2 MG/ML IJ SOSY
PREFILLED_SYRINGE | INTRAMUSCULAR | Status: DC | PRN
  Administered 2024-02-24: .2 mg via INTRAVENOUS

## 2024-02-24 MED ORDER — LORATADINE 10 MG PO TABS: 10.0000 mg | ORAL_TABLET | Freq: Every day | ORAL | Status: DC

## 2024-02-24 MED ORDER — FENTANYL CITRATE (PF) 250 MCG/5ML IJ SOLN
INTRAMUSCULAR | Status: AC
  Filled 2024-02-24: qty 5

## 2024-02-24 MED ORDER — ADULT MULTIVITAMIN W/MINERALS CH: 1.0000 | ORAL_TABLET | Freq: Every day | ORAL | Status: DC

## 2024-02-24 MED ORDER — CHLORHEXIDINE GLUCONATE 0.12 % MT SOLN
15.0000 mL | Freq: Once | OROMUCOSAL | Status: AC
  Administered 2024-02-24: 15 mL via OROMUCOSAL
  Filled 2024-02-24: qty 15

## 2024-02-24 MED ORDER — DEXMEDETOMIDINE HCL IN NACL 80 MCG/20ML IV SOLN
INTRAVENOUS | Status: DC | PRN
  Administered 2024-02-24: 12 ug via INTRAVENOUS
  Administered 2024-02-24: 8 ug via INTRAVENOUS

## 2024-02-24 SURGICAL SUPPLY — 28 items
BAG COUNTER SPONGE SURGICOUNT (BAG) ×2 IMPLANT
BLADE SURG 15 STRL LF DISP TIS (BLADE) IMPLANT
CANISTER SUCT 3000ML PPV (MISCELLANEOUS) ×2 IMPLANT
CATH ROBINSON RED A/P 10FR (CATHETERS) IMPLANT
CLEANER TIP ELECTROSURG 2X2 (MISCELLANEOUS) ×2 IMPLANT
COAGULATOR SUCT SWTCH 10FR 6 (ELECTROSURGICAL) ×2 IMPLANT
DRAPE HALF SHEET 40X57 (DRAPES) IMPLANT
ELECT COATED BLADE 2.86 ST (ELECTRODE) ×2 IMPLANT
ELECT REM PT RETURN 9FT ADLT (ELECTROSURGICAL) ×1 IMPLANT
ELECTRODE REM PT RTRN 9FT ADLT (ELECTROSURGICAL) IMPLANT
GAUZE 4X4 16PLY ~~LOC~~+RFID DBL (SPONGE) ×2 IMPLANT
GLOVE ECLIPSE 7.5 STRL STRAW (GLOVE) ×2 IMPLANT
GOWN STRL REUS W/ TWL LRG LVL3 (GOWN DISPOSABLE) ×4 IMPLANT
KIT BASIN OR (CUSTOM PROCEDURE TRAY) ×2 IMPLANT
KIT TURNOVER KIT B (KITS) ×2 IMPLANT
NDL PRECISIONGLIDE 27X1.5 (NEEDLE) IMPLANT
NEEDLE PRECISIONGLIDE 27X1.5 (NEEDLE) IMPLANT
NS IRRIG 1000ML POUR BTL (IV SOLUTION) ×2 IMPLANT
PACK SRG BSC III STRL LF ECLPS (CUSTOM PROCEDURE TRAY) ×2 IMPLANT
PAD ARMBOARD POSITIONER FOAM (MISCELLANEOUS) ×4 IMPLANT
PENCIL FOOT CONTROL (ELECTRODE) ×2 IMPLANT
SPONGE TONSIL 1.25 RF SGL STRG (GAUZE/BANDAGES/DRESSINGS) IMPLANT
SYR BULB EAR ULCER 3OZ GRN STR (SYRINGE) ×2 IMPLANT
TOWEL GREEN STERILE FF (TOWEL DISPOSABLE) ×2 IMPLANT
TUBE CONNECTING 12X1/4 (SUCTIONS) ×2 IMPLANT
TUBE SALEM SUMP 12F (TUBING) ×2 IMPLANT
TUBE SALEM SUMP 14F (TUBING) ×2 IMPLANT
WATER STERILE IRR 1000ML POUR (IV SOLUTION) ×2 IMPLANT

## 2024-02-24 NOTE — Op Note (Signed)
 02/24/2024  11:23 AM  PATIENT:  Aaron Alvarado  28 y.o. male  PRE-OPERATIVE DIAGNOSIS:  Obstructive sleep apnea Class 3 severe obesity with serious comorbidity and body mass index BMI of  40.0 to 44.9 in adult,unspecified obesity type CMD Recurrent streptococcal tonsillitis Problem List  POST-OPERATIVE DIAGNOSIS:  * No post-op diagnosis entered *  PROCEDURE:  Procedure(s): TONSILLECTOMY  SURGEON:  Surgeon(s): [name], MD  ANESTHESIA:   General  COUNTS: Correct   DICTATION: The patient was taken to the operating room and placed on the operating table in the supine position. Following induction of general endotracheal anesthesia, the table was turned and the patient was draped in a standard fashion. A Crowe-Davis mouthgag was inserted into the oral cavity and used to retract the tongue and mandible, then attached to the Mayo stand.  The tonsillectomy was then performed using electrocautery dissection, carefully dissecting the avascular plane between the capsule and constrictor muscles. Cautery was used for completion of hemostasis. The tonsils were enlarged and cryptic , and were discarded.  The pharynx was irrigated with saline and suctioned. An oral gastric tube was used to aspirate the contents of the stomach. The patient was then awakened from anesthesia and transferred to PACU in stable condition.   PATIENT DISPOSITION:  To PACU, stable

## 2024-02-24 NOTE — Discharge Summary (Signed)
 Physician Discharge Summary  Patient ID: Aaron Alvarado MRN: [id number] DOB/AGE: [birth year] 28 y.o.  Admit date: 02/24/2024 Discharge date: 02/24/2024  Admission Diagnoses: Chronic tonsillitis  Discharge Diagnoses:  Principal Problem:   S/P tonsillectomy   Discharged Condition: good  Hospital Course: No complications, taking liquids well by mouth.  Pain controlled.  No breathing difficulty.  Consults: none  Significant Diagnostic Studies: none  Treatments: surgery: Tonsillectomy  Discharge Exam: Blood pressure 126/84, pulse 83, temperature 98.3 F (36.8 C), resp. rate 16, height 6' (1.829 m), weight 127 kg, SpO2 100%. PHYSICAL EXAM: Awake and alert, breathing well.  No bleeding.  Disposition: Discharge disposition: 01-Home or Self Care       Discharge Instructions     Diet - low sodium heart healthy   Complete by: As directed    Increase activity slowly   Complete by: As directed       Allergies as of 02/24/2024   No Known Allergies      Medication List     TAKE these medications    acetaminophen 500 MG tablet Commonly known as: TYLENOL Take 1,000 mg by mouth every 6 (six) hours as needed for moderate pain (pain score 4-6), mild pain (pain score 1-3) or headache.   amLODipine 10 MG tablet Commonly known as: NORVASC Take 10 mg by mouth daily.   Descovy 200-25 MG tablet Generic drug: emtricitabine-tenofovir AF Take 1 tablet by mouth daily.   ELDERBERRY PO Take 1 Package by mouth daily.   fexofenadine 180 MG tablet Commonly known as: ALLEGRA Take 180 mg by mouth daily.   HYDROcodone-acetaminophen 7.5-325 mg/15 ml solution Commonly known as: HYCET Take 15 mLs by mouth every 6 (six) hours as needed for moderate pain (pain score 4-6).   losartan 50 MG tablet Commonly known as: COZAAR Take 50 mg by mouth daily.   Multi Adult Gummies Chew Chew 3 tablets by mouth daily.   Emergen-C Vitamin C Pack Take 1 Package by mouth daily.   ondansetron  4 MG disintegrating tablet Commonly known as: ZOFRAN-ODT Take 1 tablet (4 mg total) by mouth every 8 (eight) hours as needed for nausea or vomiting.         Signed: [name] 02/24/2024, 3:34 PM

## 2024-02-24 NOTE — Transfer of Care (Signed)
 Immediate Anesthesia Transfer of Care Note  Patient: Aaron Alvarado  Procedure(s) Performed: TONSILLECTOMY (Bilateral: Mouth)  Patient Location: PACU  Anesthesia Type:General  Level of Consciousness: awake, alert , patient cooperative, and responds to stimulation  Airway & Oxygen Therapy: Patient Spontanous Breathing and Patient connected to face mask oxygen  Post-op Assessment: Report given to RN, Post -op Vital signs reviewed and stable, and Patient moving all extremities X 4  Post vital signs: Reviewed and stable  Last Vitals:  Vitals Value Taken Time  BP 153/92 02/24/24 1216  Temp 36.8 C 02/24/24 1213  Pulse 102 02/24/24 1216  Resp 17 02/24/24 1217  SpO2 91 % 02/24/24 1216  Vitals shown include unfiled device data.  Last Pain:  Vitals:   02/24/24 1042  TempSrc:   PainSc: 0-No pain         Complications: No notable events documented.

## 2024-02-24 NOTE — Anesthesia Procedure Notes (Signed)
 Procedure Name: Intubation Date/Time: 02/24/2024 11:27 AM  Performed by: Shary Decamp, CRNAPre-anesthesia Checklist: Patient identified, Patient being monitored, Timeout performed, Emergency Drugs available and Suction available Patient Re-evaluated:Patient Re-evaluated prior to induction Oxygen Delivery Method: Circle System Utilized Preoxygenation: Pre-oxygenation with 100% oxygen Induction Type: IV induction Ventilation: Mask ventilation without difficulty Laryngoscope Size: Miller and 3 Grade View: Grade I Tube type: Oral Tube size: 7.5 mm Number of attempts: 1 Airway Equipment and Method: Stylet Placement Confirmation: ETT inserted through vocal cords under direct vision, positive ETCO2 and breath sounds checked- equal and bilateral Secured at: 24 cm Tube secured with: Tape Dental Injury: Teeth and Oropharynx as per pre-operative assessment

## 2024-02-24 NOTE — Anesthesia Preprocedure Evaluation (Signed)
 Anesthesia Evaluation  Patient identified by MRN, date of birth, ID band Patient awake    Reviewed: Allergy & Precautions, H&P , NPO status , Patient's Chart, lab work & pertinent test results  Airway Mallampati: II   Neck ROM: full    Dental   Pulmonary asthma , sleep apnea    breath sounds clear to auscultation       Cardiovascular hypertension,  Rhythm:regular Rate:Normal     Neuro/Psych  PSYCHIATRIC DISORDERS Anxiety      Neuromuscular disease    GI/Hepatic ,GERD  ,,  Endo/Other    Class 3 obesity  Renal/GU      Musculoskeletal   Abdominal   Peds  Hematology   Anesthesia Other Findings   Reproductive/Obstetrics                             Anesthesia Physical Anesthesia Plan  ASA: 2  Anesthesia Plan: General   Post-op Pain Management:    Induction: Intravenous  PONV Risk Score and Plan: 2 and Ondansetron, Dexamethasone, Midazolam and Treatment may vary due to age or medical condition  Airway Management Planned: Oral ETT  Additional Equipment:   Intra-op Plan:   Post-operative Plan: Extubation in OR  Informed Consent: I have reviewed the patients History and Physical, chart, labs and discussed the procedure including the risks, benefits and alternatives for the proposed anesthesia with the patient or authorized representative who has indicated his/her understanding and acceptance.     Dental advisory given  Plan Discussed with: CRNA, Anesthesiologist and Surgeon  Anesthesia Plan Comments:        Anesthesia Quick Evaluation

## 2024-02-24 NOTE — Anesthesia Postprocedure Evaluation (Signed)
 Anesthesia Post Note  Patient: Aaron Alvarado  Procedure(s) Performed: TONSILLECTOMY (Bilateral: Mouth)     Patient location during evaluation: PACU Anesthesia Type: General Level of consciousness: awake and alert Pain management: pain level controlled Vital Signs Assessment: post-procedure vital signs reviewed and stable Respiratory status: spontaneous breathing, nonlabored ventilation, respiratory function stable and patient connected to nasal cannula oxygen Cardiovascular status: blood pressure returned to baseline and stable Postop Assessment: no apparent nausea or vomiting Anesthetic complications: no   No notable events documented.  Last Vitals:  Vitals:   02/24/24 1330 02/24/24 1345  BP: 130/79 126/84  Pulse: 93 83  Resp: 14 16  Temp:  36.8 C  SpO2: 99% 100%    Last Pain:  Vitals:   02/24/24 1330  TempSrc:   PainSc: 4                  Nickalous Stingley S

## 2024-02-24 NOTE — Interval H&P Note (Signed)
 History and Physical Interval Note:  02/24/2024 10:28 AM  Aaron Alvarado  has presented today for surgery, with the diagnosis of Obstructive sleep apnea Class 3 severe obesity with serious comorbidity and body mass index BMI of  40.0 to 44.9 in adult,unspecified obesity type CMD Recurrent streptococcal tonsillitis Problem List.  The various methods of treatment have been discussed with the patient and family. After consideration of risks, benefits and other options for treatment, the patient has consented to  Procedure(s): TONSILLECTOMY (Bilateral) as a surgical intervention.  The patient's history has been reviewed, patient examined, no change in status, stable for surgery.  I have reviewed the patient's chart and labs.  Questions were answered to the patient's satisfaction.     [name]

## 2024-02-24 NOTE — Progress Notes (Signed)
 Postop check  Doing well, awake and alert, drinking very nicely.  Pain is under control.  No bleeding.  Stable for discharge home.

## 2024-02-25 ENCOUNTER — Encounter (HOSPITAL_COMMUNITY): Payer: Self-pay | Admitting: Otolaryngology

## 2024-06-19 ENCOUNTER — Encounter (HOSPITAL_BASED_OUTPATIENT_CLINIC_OR_DEPARTMENT_OTHER): Payer: Self-pay | Admitting: Emergency Medicine

## 2024-06-19 ENCOUNTER — Emergency Department (HOSPITAL_BASED_OUTPATIENT_CLINIC_OR_DEPARTMENT_OTHER)
Admission: EM | Admit: 2024-06-19 | Discharge: 2024-06-20 | Disposition: A | Discharge status: Home / Self Care | Attending: Emergency Medicine | Admitting: Emergency Medicine

## 2024-06-19 ENCOUNTER — Other Ambulatory Visit: Payer: Self-pay

## 2024-06-19 DIAGNOSIS — J069 Acute upper respiratory infection, unspecified: Secondary | ICD-10-CM | POA: Insufficient documentation

## 2024-06-19 DIAGNOSIS — Z202 Contact with and (suspected) exposure to infections with a predominantly sexual mode of transmission: Secondary | ICD-10-CM | POA: Diagnosis not present

## 2024-06-19 DIAGNOSIS — R509 Fever, unspecified: Secondary | ICD-10-CM | POA: Diagnosis present

## 2024-06-19 LAB — RESP PANEL BY RT-PCR (RSV, FLU A&B, COVID)  RVPGX2
Influenza A by PCR: NEGATIVE
Influenza B by PCR: NEGATIVE
Resp Syncytial Virus by PCR: NEGATIVE
SARS Coronavirus 2 by RT PCR: NEGATIVE

## 2024-06-19 LAB — GROUP A STREP BY PCR: Group A Strep by PCR: NOT DETECTED

## 2024-06-19 MED ORDER — DEXAMETHASONE 4 MG PO TABS
16.0000 mg | ORAL_TABLET | Freq: Once | ORAL | Status: AC
  Administered 2024-06-20: 16 mg via ORAL
  Filled 2024-06-19: qty 4

## 2024-06-19 MED ORDER — ACETAMINOPHEN 325 MG PO TABS
650.0000 mg | ORAL_TABLET | Freq: Once | ORAL | Status: AC | PRN
  Administered 2024-06-19: 650 mg via ORAL
  Filled 2024-06-19: qty 2

## 2024-06-19 MED ORDER — DOXYCYCLINE HYCLATE 100 MG PO TABS
100.0000 mg | ORAL_TABLET | Freq: Once | ORAL | Status: AC
  Administered 2024-06-20: 100 mg via ORAL
  Filled 2024-06-19: qty 1

## 2024-06-19 MED ORDER — DOXYCYCLINE HYCLATE 100 MG PO CAPS: 100.0000 mg | ORAL_CAPSULE | Freq: Two times a day (BID) | ORAL | 0 refills | Status: DC

## 2024-06-19 MED ORDER — CEFTRIAXONE SODIUM 500 MG IJ SOLR
500.0000 mg | Freq: Once | INTRAMUSCULAR | Status: AC
  Administered 2024-06-20: 500 mg via INTRAMUSCULAR
  Filled 2024-06-19: qty 500

## 2024-06-19 NOTE — ED Triage Notes (Signed)
 Pt c/o sore throat, head pressure/dizziness, generalized bodyaches since last night.   Good po intake.   Pt also requesting STI testing after having unprotected sex. Denies sx.

## 2024-06-20 LAB — HIV ANTIBODY (ROUTINE TESTING W REFLEX): HIV Screen 4th Generation wRfx: NONREACTIVE

## 2024-06-20 LAB — RPR: RPR Ser Ql: NONREACTIVE

## 2024-06-20 MED ORDER — LIDOCAINE HCL (PF) 1 % IJ SOLN
INTRAMUSCULAR | Status: AC
  Administered 2024-06-20: 1 mL
  Filled 2024-06-20: qty 5

## 2024-06-20 NOTE — ED Provider Notes (Signed)
 [facility] Provider Note   CSN: [id number] Arrival date & time: 06/19/24  2212     Patient presents with: Sore Throat, Fever, and Exposure to STD   Aaron Alvarado is a 28 y.o. male.   The patient presents with symptoms that began early this morning, including a sore throat, body weakness, chills, and a hot rash. The patient reports having a tonsillectomy in April and has not experienced symptoms since then. The patient also experienced diarrhea and had unprotected sexual intercourse with a new partner last night. There is a history of potential HIV exposure in March, but the patient has not been tested since April. The patient denies fever, cough, vomiting, or any discharge. The patient consumes wine but denies smoking or drug use. No other significant medical history is reported. The patient expresses concern about the symptoms and their potential relation to recent sexual activity. Sexually active with males including oral intercourse. History was obtained from the patient.   Sore Throat  Fever Exposure to STD       Prior to Admission medications   Medication Sig Start Date End Date Taking? Authorizing Provider  doxycycline  (VIBRAMYCIN ) 100 MG capsule Take 1 capsule (100 mg total) by mouth 2 (two) times daily. One po bid x 7 days 06/19/24  Yes Mayda Shippee, Selinda, MD  acetaminophen  (TYLENOL ) 500 MG tablet Take 1,000 mg by mouth every 6 (six) hours as needed for moderate pain (pain score 4-6), mild pain (pain score 1-3) or headache.    [provider]  amLODipine  (NORVASC ) 10 MG tablet Take 10 mg by mouth daily. 02/10/24   [provider]  DESCOVY  200-25 MG tablet Take 1 tablet by mouth daily. 02/10/24   [provider]  ELDERBERRY PO Take 1 Package by mouth daily.    [provider]  fexofenadine  (ALLEGRA ) 180 MG tablet Take 180 mg by mouth daily.    [provider]  HYDROcodone -acetaminophen  (HYCET)  7.5-325 mg/15 ml solution Take 15 mLs by mouth every 6 (six) hours as needed for moderate pain (pain score 4-6). 02/24/24   Jesus Oliphant, MD  losartan  (COZAAR ) 50 MG tablet Take 50 mg by mouth daily. 02/09/24   [provider]  Multiple Vitamins-Minerals (EMERGEN-C VITAMIN C ) PACK Take 1 Package by mouth daily.    [provider]  Multiple Vitamins-Minerals (MULTI ADULT GUMMIES) CHEW Chew 3 tablets by mouth daily.    [provider]  ondansetron  (ZOFRAN -ODT) 4 MG disintegrating tablet Take 1 tablet (4 mg total) by mouth every 8 (eight) hours as needed for nausea or vomiting. 02/24/24   Jesus Oliphant, MD    Allergies: Patient has no known allergies.    Review of Systems  Constitutional:  Positive for fever.    Updated Vital Signs BP (!) 155/110 (BP Location: Left Arm)   Pulse (!) 109   Temp (!) 102.8 F (39.3 C) (Oral)   Resp 20   Ht 6' (1.829 m)   SpO2 97%   BMI 37.97 kg/m   Physical Exam Vitals and nursing note reviewed.  Constitutional:      Appearance: He is well-developed.  HENT:     Head: Normocephalic and atraumatic.     Mouth/Throat:     Pharynx: Posterior oropharyngeal erythema present. No oropharyngeal exudate.  Cardiovascular:     Rate and Rhythm: Tachycardia present.  Pulmonary:     Effort: Pulmonary effort is normal. No respiratory distress.  Abdominal:     General:  There is no distension.  Musculoskeletal:        General: Normal range of motion.     Cervical back: Normal range of motion.  Neurological:     Mental Status: He is alert.     (all labs ordered are listed, but only abnormal results are displayed) Labs Reviewed  GROUP A STREP BY PCR  RESP PANEL BY RT-PCR (RSV, FLU A&B, COVID)  RVPGX2  RPR  HIV ANTIBODY (ROUTINE TESTING W REFLEX)  GC/CHLAMYDIA PROBE AMP () NOT AT [facility]    EKG: None  Radiology: No results found.   Procedures   Medications Ordered in the ED  cefTRIAXone  (ROCEPHIN ) injection 500 mg  (has no administration in time range)  doxycycline  (VIBRA -TABS) tablet 100 mg (has no administration in time range)  dexamethasone  (DECADRON ) tablet 16 mg (has no administration in time range)  acetaminophen  (TYLENOL ) tablet 650 mg (650 mg Oral Given 06/19/24 2226)                                    Medical Decision Making Amount and/or Complexity of Data Reviewed Labs: ordered.  Risk OTC drugs. Prescription drug management.   ED Course: The patient presented to the ED with symptoms of sore throat, body weakness, and a hot rash that began in the early morning hours. The patient reported having unprotected intercourse with a new partner the previous night. The patient has a history of a tonsillectomy in April and had an HIV exposure in March, with the last test being in April. The patient denied fever but reported chills and diarrhea. A physical exam revealed a slightly red throat without pus or significant swelling. COVID, flu, RSV, and strep tests were conducted and returned negative. The patient was advised to alternate Tylenol  and Ibuprofen  for symptom management and was given a dose of steroids to reduce throat inflammation. The patient was offered prophylactic antibiotics for STDs, which they accepted. STD testing was conducted, and results are pending. The patient was advised to follow up if symptoms persist beyond four to five days or if new symptoms develop.  Differential Diagnosis:   Differential diagnosis includes viral pharyngitis, bacterial pharyngitis, sexually transmitted infections, and viral syndrome.  Diagnostics Review:  Laboratory Interpretation: COVID, flu, RSV, and strep tests negative. STD tests pending.  Tests CONSIDERED but not performed: Considered additional viral testing beyond the initial panel.  Historians other than the patient: None  Care significantly affected by the following Social Determinants of Health: Unprotected sexual activity with multiple  partners.  Management:  Medications: Steroids administered in the ER; prophylactic antibiotics for STDs provided.  Potential for Clinical Deterioration: Yes, due to the potential for untreated STDs and the possibility of a developing bacterial infection.  Dispo: Discharged with instructions to follow up if symptoms persist or worsen.  High Risk of Morbidity/Mortality Consideration: Yes, due to potential for untreated STDs leading to complications.     Final diagnoses:  Upper respiratory tract infection, unspecified type  Exposure to STD    ED Discharge Orders          Ordered    doxycycline  (VIBRAMYCIN ) 100 MG capsule  2 times daily        06/19/24 2356               Vaishali Baise, Selinda, MD 06/20/24 [phone]

## 2024-06-21 LAB — GC/CHLAMYDIA PROBE AMP (~~LOC~~) NOT AT ARMC
Chlamydia: NEGATIVE
Comment: NEGATIVE
Comment: NORMAL
Neisseria Gonorrhea: NEGATIVE

## 2024-11-29 DIAGNOSIS — R7303 Prediabetes: Secondary | ICD-10-CM | POA: Insufficient documentation

## 2024-11-29 NOTE — Progress Notes (Signed)
" ° ° °  SUBJECTIVE:   CHIEF COMPLAINT / HPI:   Aaron Alvarado is a 29 y.o. male presenting to establish care. Was previously followed by [facility] care, last seen almost a year ago.  Concerns today include:  High blood pressure - is taking BP medication, but moreso taking twice a week due to not being able to get refills of his medications; occasional headaches, no blurry vision STI testing - reports he has a partner who is HIV undetectable; partner is on therapy, pt was on Descovy  in the past, not currently  Of note, works as a runner, broadcasting/film/video.  Healthcare Maintenance: - Flu shot - getting today - Covid vaccine - declined, will think about this - Hep B vaccine - A1c - ordered today  PERTINENT  PMH / PSH: HTN, prediabetes, GERD, allergic rhinitis, stenosis of lumbar spine, GAD  OBJECTIVE:   BP (!) 147/96   Pulse 65   Ht 6' 1 (1.854 m)   Wt 288 lb (130.6 kg)   SpO2 100%   BMI 38.00 kg/m    General: Patient seated in chair, no acute distress. Cardiovascular: Regular rate and rhythm, no murmurs/rubs/gallops. Respiratory: Normal work of breathing on room air. Clear to auscultation bilaterally; no wheezes, crackles. Abdomen: Bowel sounds present and normoactive bilaterally. Soft, nondistended, nontender.  ASSESSMENT/PLAN:   Assessment & Plan Primary hypertension BP elevated to 147/96 today on recheck (did improve from initial reading); however, patient has not been taking his BP medications daily due to needing a refill. Will provide refill and follow-up in 1 month to assess BP while compliant with medications. - Continue amlodipine  10 mg daily, refilled - Continue losartan  50 mg daily, refilled - Labs: BMP - Encouraged patient to keep home BP log Prediabetes A1c today of 5.4. - Continue to manage with lifestyle changes Routine screening for STI (sexually transmitted infection) - Labs: urine GC/chlamydia/trich, HIV, RPR, Hep C Vitamin D  deficiency Last Vit D level in 07/2023  was 29.5 (low). - Labs: Vit D HIV exposure Patient's primary sexual partner is HIV positive, undetectable viral load and taking medications. Discussed risks and benefits of restarting patient on Descovy , and decided to restart this medication. PA pending. - Labs: Hep B surface antibody, surface antigen, core antibody - Restarting Descovy  200 mg daily Encounter for immunization - Flu shot given today    Alan Flies, MD [facility] "

## 2024-11-29 NOTE — Assessment & Plan Note (Signed)
 A1c today of 5.4. - Continue to manage with lifestyle changes

## 2024-11-30 ENCOUNTER — Ambulatory Visit (INDEPENDENT_AMBULATORY_CARE_PROVIDER_SITE_OTHER): Payer: Self-pay

## 2024-11-30 ENCOUNTER — Other Ambulatory Visit (HOSPITAL_COMMUNITY)
Admission: RE | Admit: 2024-11-30 | Discharge: 2024-11-30 | Disposition: A | Source: Ambulatory Visit | Attending: Family Medicine | Admitting: Family Medicine

## 2024-11-30 VITALS — BP 147/96 | HR 65 | Ht 73.0 in | Wt 288.0 lb

## 2024-11-30 DIAGNOSIS — Z23 Encounter for immunization: Secondary | ICD-10-CM | POA: Diagnosis not present

## 2024-11-30 DIAGNOSIS — Z206 Contact with and (suspected) exposure to human immunodeficiency virus [HIV]: Secondary | ICD-10-CM

## 2024-11-30 DIAGNOSIS — Z113 Encounter for screening for infections with a predominantly sexual mode of transmission: Secondary | ICD-10-CM

## 2024-11-30 DIAGNOSIS — R7303 Prediabetes: Secondary | ICD-10-CM

## 2024-11-30 DIAGNOSIS — I10 Essential (primary) hypertension: Secondary | ICD-10-CM | POA: Diagnosis not present

## 2024-11-30 DIAGNOSIS — E559 Vitamin D deficiency, unspecified: Secondary | ICD-10-CM | POA: Diagnosis not present

## 2024-11-30 DIAGNOSIS — Q600 Renal agenesis, unilateral: Secondary | ICD-10-CM | POA: Insufficient documentation

## 2024-11-30 LAB — POCT GLYCOSYLATED HEMOGLOBIN (HGB A1C): Hemoglobin A1C: 5.4 % (ref 4.0–5.6)

## 2024-11-30 MED ORDER — AMLODIPINE BESYLATE 10 MG PO TABS: 10.0000 mg | ORAL_TABLET | Freq: Every day | ORAL | 3 refills | Status: AC

## 2024-11-30 MED ORDER — LOSARTAN POTASSIUM 50 MG PO TABS: 50.0000 mg | ORAL_TABLET | Freq: Every day | ORAL | 3 refills | Status: AC

## 2024-11-30 MED ORDER — DESCOVY 200-25 MG PO TABS: 1.0000 | ORAL_TABLET | Freq: Every day | ORAL | 3 refills | Status: AC

## 2024-11-30 NOTE — Assessment & Plan Note (Addendum)
 Last Vit D level in 07/2023 was 29.5 (low). - Labs: Vit D

## 2024-11-30 NOTE — Patient Instructions (Addendum)
 Aaron Alvarado,   It was great seeing you in clinic today! You came in to establish care with a new primary care physician.   We gave you your flu shot today, and I got labs on you to check for STIs and to look at your kidney function. I also sent in refills for your BP medications and restarted your Descovy .  Today's visit was mostly an initial get-to-know-you visit. I'll see you back for your yearly physical, where we will talk more about your overall health.  Please bring all your medications to your next office visit.  Thank you for allowing me to be a part of your care team! Alan Flies, MD [name] [address] [place], [place], [place] [address] [phone]

## 2024-11-30 NOTE — Assessment & Plan Note (Signed)
 BP elevated to 147/96 today on recheck (did improve from initial reading); however, patient has not been taking his BP medications daily due to needing a refill. Will provide refill and follow-up in 1 month to assess BP while compliant with medications. - Continue amlodipine  10 mg daily, refilled - Continue losartan  50 mg daily, refilled - Labs: BMP - Encouraged patient to keep home BP log

## 2024-12-01 LAB — HEPATITIS B SURFACE ANTIBODY, QUANTITATIVE: Hepatitis B Surf Ab Quant: <3.5 m[IU]/mL — ABNORMAL LOW

## 2024-12-01 LAB — BASIC METABOLIC PANEL WITH GFR
BUN/Creatinine Ratio: 12 (ref 9–20)
BUN: 11 mg/dL (ref 6–20)
CO2: 21 mmol/L (ref 20–29)
Calcium: 9.9 mg/dL (ref 8.7–10.2)
Chloride: 101 mmol/L (ref 96–106)
Creatinine, Ser: 0.94 mg/dL (ref 0.76–1.27)
Glucose: 83 mg/dL (ref 70–99)
Potassium: 4.3 mmol/L (ref 3.5–5.2)
Sodium: 137 mmol/L (ref 134–144)
eGFR: 114 mL/min/1.73

## 2024-12-01 LAB — SYPHILIS: RPR W/REFLEX TO RPR TITER AND TREPONEMAL ANTIBODIES, TRADITIONAL SCREENING AND DIAGNOSIS ALGORITHM: RPR Ser Ql: NONREACTIVE

## 2024-12-01 LAB — HEPATITIS B SURFACE ANTIGEN: Hepatitis B Surface Ag: NEGATIVE

## 2024-12-01 LAB — VITAMIN D 25 HYDROXY (VIT D DEFICIENCY, FRACTURES): Vit D, 25-Hydroxy: 26.9 ng/mL — ABNORMAL LOW (ref 30.0–100.0)

## 2024-12-01 LAB — HIV ANTIBODY (ROUTINE TESTING W REFLEX): HIV Screen 4th Generation wRfx: NONREACTIVE

## 2024-12-01 LAB — HEPATITIS B CORE AB, IGM: Hep B C IgM: NEGATIVE

## 2024-12-01 LAB — HEPATITIS C ANTIBODY: Hep C Virus Ab: NONREACTIVE

## 2024-12-03 ENCOUNTER — Ambulatory Visit: Payer: Self-pay

## 2024-12-03 LAB — URINE CYTOLOGY ANCILLARY ONLY
Chlamydia: NEGATIVE
Comment: NEGATIVE
Comment: NEGATIVE
Comment: NORMAL
Neisseria Gonorrhea: NEGATIVE
Trichomonas: NEGATIVE

## 2024-12-03 NOTE — Progress Notes (Signed)
 Called patient to discuss results of recent labs. Informed him that his STI testing was negative, his electrolytes/kidney function was normal, and he did not have evidence of a Hep B infection. Did discuss that his Hep B ab level was low, meaning he no longer has immunity to this. Will plan for Hep B vaccination at follow-up in 12/2024. Also informed him Vit D is borderline low, and he can continue OTC Vit D supplementation if he wishes.

## 2024-12-07 ENCOUNTER — Other Ambulatory Visit (HOSPITAL_COMMUNITY): Payer: Self-pay

## 2024-12-28 ENCOUNTER — Ambulatory Visit: Payer: Self-pay
